# Patient Record
Sex: Female | Born: 1960 | Race: Black or African American | Hispanic: No | Marital: Single | Smoking: Never smoker
Health system: Southern US, Community
[De-identification: ages and names within clinical notes are randomized; demographics above are authoritative.]

## PROBLEM LIST (undated history)

## (undated) HISTORY — PX: TUBAL LIGATION: SHX77

---

## 1998-02-20 ENCOUNTER — Emergency Department (HOSPITAL_COMMUNITY): Admission: EM | Admit: 1998-02-20 | Discharge: 1998-02-20 | Payer: Self-pay | Admitting: Emergency Medicine

## 1998-04-27 ENCOUNTER — Emergency Department (HOSPITAL_COMMUNITY): Admission: EM | Admit: 1998-04-27 | Discharge: 1998-04-27 | Payer: Self-pay | Admitting: Family Medicine

## 1998-10-27 ENCOUNTER — Emergency Department (HOSPITAL_COMMUNITY): Admission: EM | Admit: 1998-10-27 | Discharge: 1998-10-27 | Payer: Self-pay | Admitting: Emergency Medicine

## 2000-02-08 ENCOUNTER — Inpatient Hospital Stay (HOSPITAL_COMMUNITY): Admission: AD | Admit: 2000-02-08 | Discharge: 2000-02-08 | Payer: Self-pay | Admitting: Obstetrics

## 2000-02-09 ENCOUNTER — Encounter: Payer: Self-pay | Admitting: Obstetrics

## 2000-02-09 ENCOUNTER — Ambulatory Visit (HOSPITAL_COMMUNITY): Admission: AD | Admit: 2000-02-09 | Discharge: 2000-02-09 | Payer: Self-pay | Admitting: Obstetrics

## 2000-02-22 ENCOUNTER — Encounter (INDEPENDENT_AMBULATORY_CARE_PROVIDER_SITE_OTHER): Payer: Self-pay | Admitting: Specialist

## 2000-02-22 ENCOUNTER — Encounter: Payer: Self-pay | Admitting: Obstetrics

## 2000-02-22 ENCOUNTER — Ambulatory Visit (HOSPITAL_COMMUNITY): Admission: AD | Admit: 2000-02-22 | Discharge: 2000-02-22 | Payer: Self-pay | Admitting: Obstetrics

## 2001-10-19 ENCOUNTER — Emergency Department (HOSPITAL_COMMUNITY): Admission: EM | Admit: 2001-10-19 | Discharge: 2001-10-19 | Payer: Self-pay

## 2002-09-20 ENCOUNTER — Inpatient Hospital Stay (HOSPITAL_COMMUNITY): Admission: AD | Admit: 2002-09-20 | Discharge: 2002-09-20 | Payer: Self-pay | Admitting: Obstetrics and Gynecology

## 2002-09-23 ENCOUNTER — Other Ambulatory Visit: Admission: RE | Admit: 2002-09-23 | Discharge: 2002-09-23 | Payer: Self-pay | Admitting: Obstetrics and Gynecology

## 2002-11-19 ENCOUNTER — Encounter: Payer: Self-pay | Admitting: Obstetrics and Gynecology

## 2002-11-19 ENCOUNTER — Ambulatory Visit (HOSPITAL_COMMUNITY): Admission: RE | Admit: 2002-11-19 | Discharge: 2002-11-19 | Payer: Self-pay | Admitting: Obstetrics and Gynecology

## 2002-12-17 ENCOUNTER — Encounter: Payer: Self-pay | Admitting: Obstetrics and Gynecology

## 2002-12-17 ENCOUNTER — Ambulatory Visit (HOSPITAL_COMMUNITY): Admission: RE | Admit: 2002-12-17 | Discharge: 2002-12-17 | Payer: Self-pay | Admitting: Obstetrics and Gynecology

## 2003-04-22 ENCOUNTER — Inpatient Hospital Stay (HOSPITAL_COMMUNITY): Admission: AD | Admit: 2003-04-22 | Discharge: 2003-04-22 | Payer: Self-pay | Admitting: Obstetrics and Gynecology

## 2003-04-27 ENCOUNTER — Inpatient Hospital Stay (HOSPITAL_COMMUNITY): Admission: AD | Admit: 2003-04-27 | Discharge: 2003-04-27 | Payer: Self-pay | Admitting: Obstetrics and Gynecology

## 2003-04-28 ENCOUNTER — Inpatient Hospital Stay (HOSPITAL_COMMUNITY): Admission: AD | Admit: 2003-04-28 | Discharge: 2003-05-01 | Payer: Self-pay | Admitting: Obstetrics and Gynecology

## 2003-04-28 ENCOUNTER — Encounter (INDEPENDENT_AMBULATORY_CARE_PROVIDER_SITE_OTHER): Payer: Self-pay | Admitting: *Deleted

## 2004-04-22 ENCOUNTER — Emergency Department (HOSPITAL_COMMUNITY): Admission: EM | Admit: 2004-04-22 | Discharge: 2004-04-22 | Payer: Self-pay | Admitting: Emergency Medicine

## 2005-06-17 ENCOUNTER — Emergency Department (HOSPITAL_COMMUNITY): Admission: EM | Admit: 2005-06-17 | Discharge: 2005-06-17 | Payer: Self-pay | Admitting: Emergency Medicine

## 2008-07-05 ENCOUNTER — Encounter: Admission: RE | Admit: 2008-07-05 | Discharge: 2008-07-05 | Payer: Self-pay | Admitting: Obstetrics and Gynecology

## 2009-08-05 ENCOUNTER — Encounter: Admission: RE | Admit: 2009-08-05 | Discharge: 2009-08-05 | Payer: Self-pay | Admitting: Obstetrics and Gynecology

## 2010-10-27 NOTE — Discharge Summary (Signed)
NAME:  Tammy Villarreal, Tammy Villarreal                         ACCOUNT NO.:  [id number]   MEDICAL RECORD NO.:  [id number]                   PATIENT TYPE:  INP   LOCATION:  9143                                 [facility]   PHYSICIAN:  Osborn Coho, M.D.                DATE OF BIRTH:  [birth year]   DATE OF ADMISSION:  04/28/2003  DATE OF DISCHARGE:  05/01/2003                                 DISCHARGE SUMMARY   ADMISSION DIAGNOSES:  1. Intrauterine pregnancy at 41-3/7 weeks.  2. Favorable cervix for induction of labor.   DISCHARGE DIAGNOSES:  1. Intrauterine pregnancy at 41-3/7 weeks.  2. Favorable cervix for induction of labor.  3. Nonreassuring fetal heart rate tracing.  4. Nuchal cord.  5. Status post low transverse cesarean section.  6. Status post bilateral tubal ligation for sterilization.  7. Premature atrial and junctional beat.  8. Bottle-feeding.   PROCEDURES THIS ADMISSION:  1. A primary low transverse cesarean section for delivery of a viable female     infant who had Apgars of 6 and 8 and weighed 6 pounds 1 ounce with cord     pH of 7.31 on April 28, 2003 attended in delivery by Dr. Normand Sloop and     Mack Guise, C.N.M.  2. Bilateral tubal ligation for sterilization at the same time of cesarean     section.   HOSPITAL COURSE:  [name] is a 50 year old black female gravida 5, para  3-0-1-4, at 41-3/7 weeks who was admitted for induction of labor secondary  to being postdates.  Her admission was initiated by artificial rupture of  the membranes however, no fluid was noted.  Several hours following  artificial rupture of the membranes she was having more uterine contractions  but that fetal heart rate also had significant variables down to the 60s for  approximately 5 minutes.  They were repetitive with the contractions and did  not resolve with amnioinfusion or position changes.  Her cervix at this time  was still 3 cm and it was recommended that she proceed with a  cesarean  section for delivery secondary to persistent variable decelerations.  The  patient already had indicated a desire for bilateral tubal ligation for  sterilization and underwent cesarean section for delivery and bilateral  tubal ligation for sterilization during her cesarean section.  She was  delivered of a viable female infant who had Apgars of 6 and 8 and weighed 6  pounds 1 ounce with a cord pH of 7.31 attended in delivery by Dr. Normand Sloop  and Mack Guise, C.N.M.  Please see operative note for further details.  Postoperatively the patient was noted to have irregular junctional rhythm  and PVCs and was monitored by telemetry for 24 hours following surgery.  She  had a cardiology consult which did diagnose premature atrial and junctional  beat but without symptoms and was therefore, recommended no specific  treatment unless  she became symptomatic.  She has continued to be  asymptomatic with any cardiac issues.  She is ambulating, voiding, and  eating without difficulty.  Her vital signs are stable, she has been  afebrile, and she is deemed ready for discharge today.   DISCHARGE INSTRUCTIONS:  Her discharge instructions are as per the [facility].   DISCHARGE MEDICATIONS:  1. Motrin 600 mg p.o. q.6h. p.r.n. for pain.  2. Tylox one to two p.o. q.4-6h. p.r.n. for pain.  3. Prenatal vitamins daily.   DISCHARGE LABORATORIES:  Her WBC count is 9.8, hemoglobin is 11, and  platelets are 269.   DISCHARGE FOLLOWUP:  Four to six weeks at [facility] or p.r.n.     Concha Pyo. Duplantis, C.N.M.              Osborn Coho, M.D.    SJD/MEDQ  D:  05/01/2003  T:  05/01/2003  Job:  [id number]

## 2010-10-27 NOTE — H&P (Signed)
NAME:  Tammy Villarreal, Tammy Villarreal                         ACCOUNT NO.:  [id number]   MEDICAL RECORD NO.:  [id number]                   PATIENT TYPE:  INP   LOCATION:  9173                                 [facility]   PHYSICIAN:  Naima A. Dillard, M.D.              DATE OF BIRTH:  [birth year]   DATE OF ADMISSION:  04/28/2003  DATE OF DISCHARGE:                                HISTORY & PHYSICAL   ADMITTING DIAGNOSES:  1. Intrauterine pregnancy at 41 and three-sevenths weeks.  2. Favorable cervix.  3. Induction of labor for post dates.  4. Desires sterilization.   Ms. Bouza is a gravida 5 para 3-0-1-4 at 26 and three-sevenths weeks; EDD  April 18, 2003 by 13-week ultrasound confirmed with follow-up ultrasound.  She is admitted for induction of labor secondary to post dates with  favorable cervix.  She does report positive fetal movement and regular  contractions.  No bleeding, no rupture of membranes.  She denies any PIH  symptoms; no headache, visual changes, or epigastric pain.  Her pregnancy  has been followed by the CNM service at [facility] and is remarkable for:  1. Advanced maternal age, amniocentesis declined.  2. Questionable LMP with irregular cycles.  3. Obesity.  4. Group B strep negative.  5. Desires sterilization.   This patient was initially evaluated at the office of [facility] on September 16, 2002  at approximately eight weeks gestation.  EDC determined by pregnancy  ultrasonography at 13 weeks three days and confirmed with follow-up  ultrasound.  Her pregnancy has been remarkable for nausea and vomiting early  in pregnancy with hyperemesis.  This did resolve at approximately 14-16  weeks at which time her nausea decreased and she began eating and gaining  weight.  She has continued to have ptyalism with excessive spitting  throughout her pregnancy.  Otherwise, she has been size equal to dates,  normotensive, with no proteinuria.   PRENATAL LABORATORY DATA:  Prenatal lab work on  September 23, 2002:  Hemoglobin  and hematocrit 12.9 and 39.5; platelets 365,000.  Blood type and Rh O  positive, antibody screen negative.  Sickle cell trait negative.  VDRL  nonreactive.  Rubella immune.  Hepatitis B surface antigen negative.  HIV  nonreactive.  Pap smear within normal limits.  GC and chlamydia negative.  Quad screen declined.  At 28 weeks one-hour glucose challenge was within  normal limits and hemoglobin 11.9.  At 36 weeks culture of the vaginal tract  is negative for group B strep and also negative for GC and chlamydia.   OBSTETRICAL HISTORY:  1. In 1984 the patient had a normal spontaneous vaginal delivery at term of     a 7-pound 7-ounce female infant named [place] with no complications.  2. In 1985 the patient had a normal spontaneous vaginal delivery at term     with the birth of an 8-pound female infant named Tania with  no     complications.  3. In 1987 the patient had a twin gestation and at term she had two vaginal     deliveries.  The first baby weighed 7 pounds, a girl named Sherrelle and     the second girl 7 pounds also, Shantelle.  There were no complications     with that pregnancy or delivery.  4. In 2002 the patient had a first trimester SAB with a D&C and no     complications.  5. This current pregnancy is with a different father of the baby and he is     involved and supportive.   MEDICAL HISTORY:  The patient has no known drug allergies and denies the use  of tobacco, alcohol, or illicit drugs.  Medical history is significant for  the patient had a blood transfusion after delivery of her twins in 65 and  had her wisdom teeth removed in 2002.  Otherwise, her history is  unremarkable.   FAMILY HISTORY:  The patient's father with MI.  Mother - varicose veins.  The patient's father with a history of diabetes.   GENETIC HISTORY:  Significant for the patient with advanced maternal age, no  amniocentesis.  There has been no family history of genetic or  chromosomal  disorders, children that died in infancy, or that were born with birth  defects.   SOCIAL HISTORY:  Ms. Heinke is a 50 year old African-American female.  The  father of the baby, Hatim Nour, is involved and supportive.  They are  [place] in their faith.   REVIEW OF SYSTEMS:  Is as described above.  The patient is typical of one  with a uterine pregnancy at term.  She is contracting irregularly, her baby  is reactive, cervix favorable, and she is admitted for induction of labor.   PHYSICAL EXAMINATION:  VITAL SIGNS:  Stable, afebrile.  HEENT:  Unremarkable.  HEART:  Regular rate and rhythm.  LUNGS:  Clear.  ABDOMEN:  Gravid in it contour.  Uterine fundus is noted to extend 38 cm  above the level of the pubic symphysis.  Leopold's maneuvers finds the  infant to be a longitudinal lie, cephalic presentation, and the estimated  fetal weight is 7.5 pounds.  The baseline of the fetal heart rate is 150s  with positive variability, positive accelerations.  Fetal heart rate is  reactive with no periodic changes.  The patient is contracting irregularly.  PELVIC:  Digital exam of the cervix finds it to be 2-3 cm dilated, 60-70%  effaced.  The cephalic presenting part is at a -2 station.  Artificial  rupture of membranes was attempted.  There are no forewaters and no fluid  return was noted.  EXTREMITIES:  Show no pathologic edema.  DTRs are 1+ with no clonus.   ASSESSMENT:  1. Intrauterine pregnancy at 41 and three-sevenths weeks.  2. Induction of labor for post dates.  3. Desires sterilization.   PLAN:  Will follow expectantly at present.  The patient will be out of bed  and ambulate.  Following that the plan will be to reevaluate her cervix and  contraction pattern and if no progress at that time the plan is to start  Pitocin to augment labor.  The risks and benefits associated with Pitocin including possible fetal distress and increased risk of C-section were  discussed.   The patient is in agreement with above plan.     [name], C.N.M.  Naima A. Normand Sloop, M.D.    SDM/MEDQ  D:  04/28/2003  T:  04/28/2003  Job:  [id number]

## 2010-10-27 NOTE — Op Note (Signed)
[facility]  Patient:    Tammy Villarreal, Tammy Villarreal                        MRN: [id number] Proc. Date: 02/22/00 Attending:  Kathreen Cosier, M.D.                           Operative Report  PREOPERATIVE DIAGNOSIS:       Intrauterine fetal demise between seven and                               11 weeks.  POSTOPERATIVE DIAGNOSIS:      Intrauterine fetal demise between seven and                               11 weeks.  OPERATION:                    D&E.  SURGEON:                      Kathreen Cosier, M.D.  ASSISTANT:  ANESTHESIA:                   MAC anesthesia.  DESCRIPTION OF PROCEDURE:     Using MAC with the patient in the lithotomy position, perineum and vagina were prepped and draped.  Bladder was emptied with straight catheter.  Bimanual examination revealed uterus 10 weeks size, retroverted.  Weighted speculum was placed in the vagina.  Anterior lip of the cervix was grasped with the tenaculum.  Cervix easily dilated to #29 Shawnie Pons. Using a #8 suction, the uterine contents aspirated until the cavity was clean. The patient tolerated the procedure well and was taken to the recovery room in good condition. DD:  03/14/00 TD:  03/14/00 Job: [id number]

## 2010-10-27 NOTE — Op Note (Signed)
NAME:  Tammy Villarreal, Tammy Villarreal                         ACCOUNT NO.:  [id number]   MEDICAL RECORD NO.:  [id number]                   PATIENT TYPE:  INP   LOCATION:  9371                                 [facility]   PHYSICIAN:  Tammy Villarreal, M.D.              DATE OF BIRTH:  [birth year]   DATE OF PROCEDURE:  04/28/2003  DATE OF DISCHARGE:                                 OPERATIVE REPORT   PREOPERATIVE DIAGNOSES:  1. Pregnancy at 41 weeks.  2. Nonreassuring fetal heart tones.  3. Failed induction.   POSTOPERATIVE DIAGNOSES:  1. Pregnancy at 41 weeks.  2. Nonreassuring fetal heart tones.  3. Failed induction.   PROCEDURES:  1. Primary low transverse cesarean section.  2. Bilateral tubal ligation.   ANESTHESIA:  Spinal.   SURGEON:  Tammy Villarreal, M.D.   ASSESSMENT:  Tammy Villarreal, C.N.M.   ESTIMATED BLOOD LOSS:  500 mL.   URINE OUTPUT:  500 mL.   FLUIDS REPLACED:  2800 mL crystalloid.   FINDINGS:  A female infant in vertex presentation.  Terminal meconium noted.  There was a nuchal cord x1.  There was no fluid seen.  Weight was 6 pounds 1  ounce.  Cord pH was 7.31.  The patient had normal-appearing uterus, tubes,  and ovaries bilaterally.  There were no complications for the procedure.  The patient was consented for cesarean section.  She was told the risks are  but not limited to bleeding, infection, damage to internal organs such as  bowel or bladder or major blood vessels, and complications from the  anesthesia.  She also understood the tubal failure rate to be about one in  200 to one in 300.  Half of pregnancies that do occur are ectopic  pregnancies, and all birth control was reviewed.  The patient still wanted  to proceed with C-section and tubal ligation.   She was taken to the operating room and given spinal anesthesia, placed in  the dorsal supine position with a left lateral tilt.  A Pfannenstiel skin  incision was then made with the knife and carried  down with the Bovie.  The  fascia was incised in the midline, extended bilaterally using pickups with  teeth and Mayos.  Kochers x2 were placed on the superior aspect of the  fascia, which was dissected off the rectus muscle both superiorly and  inferiorly with blunt and sharp dissection.  The rectus muscle was separated  in the midline.  The peritoneum was identified, tented up and entered  sharply, and extended superiorly and inferiorly with good visualization of  bowel and bladder.  The vesicouterine peritoneum was identified, tented up,  and entered sharply with Metzenbaum scissors and extended bilaterally. A  bladder blade was inserted.  A lower transverse uterine incision was made  with a scalpel and extended bilaterally using bandage scissors.  The infant  was delivered.  It was delivered  without difficulty.  Mouth and nares were  bulb-suctioned.  A nuchal cord x1 was reduced.  The body was delivered,  handed to pediatric in attendance.  The cord was clamped and cut before  handing over to pediatricians.  Cord gases and cord blood were obtained.  The placenta was manually removed.  The uterus was cleared of all clot and  debris.  The uterine incision was repaired with 0 Vicryl in a running locked  fashion.  A second layer of imbrication with 0 Vicryl was used and  hemostasis was assured.  The patient's left fallopian tube was identified,  followed out to the fimbriated end, and grasped with Babcock clamps and  about a centimeter of the isthmic portion of the tube was ligated with 2-0  plain and excised.  Hemostasis was assured.  The patient's right fallopian  tube was manipulated in a similar fashion, excised in a similar fashion.  About a 1 cm mid-isthmic portion of the tube was ligated with 2-0 plain.  Hemostasis was assured.  Irrigation was then done in the abdomen.  There was  noted to be another area of bleeding, which was on the uterus, which was  made hemostatic with a  figure-of-eight 0 Vicryl stitch.  Once hemostasis was  obtained, the peritoneum was closed with 0 chromic.  The fascia was closed  with 0 Vicryl in a running fashion.  The subcutaneous tissue was made  hemostatic with Bovie cautery.  A Jackson-Pratt drain was placed.  The skin  was closed with staples.  Sponge, lap, and needle counts were correct x2.  The patient went to the recovery room in stable condition.                                               Tammy Villarreal, M.D.    NAD/MEDQ  D:  04/28/2003  T:  04/29/2003  Job:  [id number]

## 2010-10-27 NOTE — Consult Note (Signed)
NAMELOUCILE, POSNER NO.:  [id number]   MEDICAL RECORD NO.:  [id number]                   PATIENT TYPE:   LOCATION:                                       FACILITY:   PHYSICIAN:  Lesleigh Noe, M.D.            DATE OF BIRTH:   DATE OF CONSULTATION:  04/28/2003  DATE OF DISCHARGE:                                   CONSULTATION   CONSULTING PHYSICIAN:  Lyn Records, M.D.   INDICATION:  Cardiac arrhythmia following C-section.   CONCLUSIONS:  1. Variable heart rate due to frequent PACs and premature junctional     contractions, asymptomatic.'  2. Cesarean section with delivery of healthy baby.  3. No prior history of heart disease, arrhythmia.  Does have a family     history of coronary artery disease, her father who is diabetic and obese.   RECOMMENDATIONS:  1. Observation.  There is no clinical indication for further cardiac     evaluation unless sustained tachy or BRADY arrhythmia or other     cardiopulmonary complaint.  2. EKG, to establish baseline.  3. May transfer to non-telemetry floor in a.m. if the patient remains     asymptomatic and has no sustained arrhythmia.   COMMENT:  The patient is 42-years-of-age, has a family history of coronary  artery disease (father who is diabetic).  She underwent a cesarean section  today, delivering a normal baby.  There was no mention of complications  during the procedure.  There were no prenatal obstetrical complications.  The patient is asymptomatic despite the presence of frequent PACs, a  variable heart rate, and a reported heart rate as low as 29, although I can  not find this documented any place in the chart.   SIGNIFICANT PAST MEDICAL HISTORY:  None.  Specifically denies diabetes,  cigarette smoking, hypertension, hypercholesterolemia.   PREVIOUS OBSTETRICAL HISTORY:  Has four children, all in good health.   ALLERGIES:  None known.   HABITS:  Does not smoke or drink.   PHYSICAL  EXAMINATION:  GENERAL:  The patient is in no acute distress.  VITAL SIGNS:  Heart rate is between 60-80, sinus rhythm with frequent PACs.  No sustained tachy arrhythmias are noted.  Blood pressure 118/70.  NECK:  Neck veins are not distended.  LUNGS:  Clear.  CARDIAC:  With the exception of irregular rhythm, no abnormalities noted.  No murmur.  No click.  No rub.  EXTREMITIES:  No edema.   Resting 12-lead EKG is normal, other than sinus tachycardia at the time  performed at 3:51 p.m.   LABORATORY DATA:  Laboratory data including potassium and magnesium is  pending.  Lesleigh Noe, M.D.    HWS/MEDQ  D:  04/28/2003  T:  04/29/2003  Job:  [id number]   cc:   Naima A. Normand Sloop, M.D.  [address]  [place]  [place] [address]  Fax: [phone]

## 2012-10-07 ENCOUNTER — Other Ambulatory Visit (HOSPITAL_COMMUNITY)
Admission: RE | Admit: 2012-10-07 | Discharge: 2012-10-07 | Disposition: A | Discharge status: Home / Self Care | Payer: Managed Care, Other (non HMO) | Source: Ambulatory Visit | Attending: Family Medicine | Admitting: Family Medicine

## 2012-10-07 ENCOUNTER — Other Ambulatory Visit: Payer: Self-pay | Admitting: Family Medicine

## 2012-10-07 DIAGNOSIS — Z01419 Encounter for gynecological examination (general) (routine) without abnormal findings: Secondary | ICD-10-CM | POA: Insufficient documentation

## 2012-10-07 DIAGNOSIS — Z1151 Encounter for screening for human papillomavirus (HPV): Secondary | ICD-10-CM | POA: Insufficient documentation

## 2013-08-22 ENCOUNTER — Ambulatory Visit (INDEPENDENT_AMBULATORY_CARE_PROVIDER_SITE_OTHER): Payer: Managed Care, Other (non HMO) | Admitting: Emergency Medicine

## 2013-08-22 VITALS — BP 112/76 | HR 56 | Temp 98.6°F | Resp 16 | Ht 67.0 in | Wt 201.0 lb

## 2013-08-22 DIAGNOSIS — J209 Acute bronchitis, unspecified: Secondary | ICD-10-CM

## 2013-08-22 MED ORDER — AZITHROMYCIN 250 MG PO TABS: ORAL_TABLET | ORAL | Status: DC

## 2013-08-22 MED ORDER — PROMETHAZINE-CODEINE 6.25-10 MG/5ML PO SYRP: 5.0000 mL | ORAL_SOLUTION | Freq: Four times a day (QID) | ORAL | Status: DC | PRN

## 2013-08-22 NOTE — Progress Notes (Signed)
[facility] [address], [place] [id number] [phone]  Date:  08/22/2013   Name:  Tammy Amelona T Herrin   DOB:  1960/12/07   MRN:  [id number]  PCP:  No primary provider on file.    Chief Complaint: Nasal Congestion, Cough and Sore Throat   History of Present Illness:  Tammy Villarreal is a 53 y.o. very pleasant female patient who presents with the following:  Ill for two weeks with nasal congestion and drainage.  Has post nasal drip and sore throat.  No fever but chills.  No shortness of breath but wheezes at times.  Has cough productive of green sputum.  No nausea or vomiting.  No stool changes or rash.  No systemic issues.  No improvement with over the counter medications or other home remedies. Denies other complaint or health concern today.   There are no active problems to display for this patient.   History reviewed. No pertinent past medical history.  Past Surgical History  Procedure Laterality Date  . Cesarean section      History  Substance Use Topics  . Smoking status: Never Smoker   . Smokeless tobacco: Not on file  . Alcohol Use: Not on file    No family history on file.  No Known Allergies  Medication list has been reviewed and updated.  No current outpatient prescriptions on file prior to visit.   No current facility-administered medications on file prior to visit.    Review of Systems:  As per HPI, otherwise negative.    Physical Examination: Filed Vitals:   08/22/13 1116  BP: 112/76  Pulse: 56  Temp: 98.6 F (37 C)  Resp: 16   Filed Vitals:   08/22/13 1116  Height: 5\' 7"  (1.702 m)  Weight: 201 lb (91.173 kg)   Body mass index is 31.47 kg/(m^2). Ideal Body Weight: Weight in (lb) to have BMI = 25: 159.3  GEN: WDWN, NAD, Non-toxic, A & O x 3 HEENT: Atraumatic, Normocephalic. Neck supple. No masses, No LAD. Ears and Nose: No external deformity. CV: RRR, No M/G/R. No JVD. No thrill. No extra heart sounds. PULM:  CTA B, no wheezes, crackles, rhonchi. No retractions. No resp. distress. No accessory muscle use. ABD: S, NT, ND, +BS. No rebound. No HSM. EXTR: No c/c/e NEURO Normal gait.  PSYCH: Normally interactive. Conversant. Not depressed or anxious appearing.  Calm demeanor.    Assessment and Plan: Bronchitis zpak Phen c cod  Signed,  Phillips OdorJeffery Zyanya Glaza [place], MD

## 2013-08-22 NOTE — Patient Instructions (Signed)

## 2014-02-01 ENCOUNTER — Other Ambulatory Visit: Payer: Self-pay

## 2014-02-01 DIAGNOSIS — Z1231 Encounter for screening mammogram for malignant neoplasm of breast: Secondary | ICD-10-CM

## 2014-02-12 ENCOUNTER — Ambulatory Visit
Admission: RE | Admit: 2014-02-12 | Discharge: 2014-02-12 | Disposition: A | Discharge status: Home / Self Care | Payer: Managed Care, Other (non HMO) | Source: Ambulatory Visit

## 2014-02-12 DIAGNOSIS — Z1231 Encounter for screening mammogram for malignant neoplasm of breast: Secondary | ICD-10-CM

## 2014-02-17 ENCOUNTER — Other Ambulatory Visit: Payer: Self-pay | Admitting: Family Medicine

## 2014-02-17 DIAGNOSIS — R928 Other abnormal and inconclusive findings on diagnostic imaging of breast: Secondary | ICD-10-CM

## 2014-02-25 ENCOUNTER — Ambulatory Visit
Admission: RE | Admit: 2014-02-25 | Discharge: 2014-02-25 | Disposition: A | Discharge status: Home / Self Care | Payer: Managed Care, Other (non HMO) | Source: Ambulatory Visit | Attending: Family Medicine | Admitting: Family Medicine

## 2014-02-25 DIAGNOSIS — R928 Other abnormal and inconclusive findings on diagnostic imaging of breast: Secondary | ICD-10-CM

## 2015-02-14 ENCOUNTER — Emergency Department (HOSPITAL_BASED_OUTPATIENT_CLINIC_OR_DEPARTMENT_OTHER): Payer: Managed Care, Other (non HMO)

## 2015-02-14 ENCOUNTER — Emergency Department (HOSPITAL_BASED_OUTPATIENT_CLINIC_OR_DEPARTMENT_OTHER)
Admission: EM | Admit: 2015-02-14 | Discharge: 2015-02-14 | Disposition: A | Discharge status: Home / Self Care | Payer: Managed Care, Other (non HMO) | Attending: Emergency Medicine | Admitting: Emergency Medicine

## 2015-02-14 ENCOUNTER — Encounter (HOSPITAL_BASED_OUTPATIENT_CLINIC_OR_DEPARTMENT_OTHER): Payer: Self-pay

## 2015-02-14 DIAGNOSIS — R1031 Right lower quadrant pain: Secondary | ICD-10-CM | POA: Insufficient documentation

## 2015-02-14 DIAGNOSIS — Z3202 Encounter for pregnancy test, result negative: Secondary | ICD-10-CM | POA: Diagnosis not present

## 2015-02-14 DIAGNOSIS — R109 Unspecified abdominal pain: Secondary | ICD-10-CM

## 2015-02-14 LAB — CBC WITH DIFFERENTIAL/PLATELET
BASOS PCT: 0 % (ref 0–1)
Basophils Absolute: 0 10*3/uL (ref 0.0–0.1)
EOS ABS: 0.2 10*3/uL (ref 0.0–0.7)
Eosinophils Relative: 3 % (ref 0–5)
HEMATOCRIT: 36 % (ref 36.0–46.0)
HEMOGLOBIN: 11.7 g/dL — AB (ref 12.0–15.0)
LYMPHS ABS: 2.2 10*3/uL (ref 0.7–4.0)
Lymphocytes Relative: 32 % (ref 12–46)
MCH: 27.3 pg (ref 26.0–34.0)
MCHC: 32.5 g/dL (ref 30.0–36.0)
MCV: 84.1 fL (ref 78.0–100.0)
Monocytes Absolute: 0.6 10*3/uL (ref 0.1–1.0)
Monocytes Relative: 9 % (ref 3–12)
NEUTROS ABS: 3.8 10*3/uL (ref 1.7–7.7)
NEUTROS PCT: 56 % (ref 43–77)
Platelets: 293 10*3/uL (ref 150–400)
RBC: 4.28 MIL/uL (ref 3.87–5.11)
RDW: 14.2 % (ref 11.5–15.5)
WBC: 6.8 10*3/uL (ref 4.0–10.5)

## 2015-02-14 LAB — URINALYSIS, ROUTINE W REFLEX MICROSCOPIC
Bilirubin Urine: NEGATIVE
GLUCOSE, UA: NEGATIVE mg/dL
Hgb urine dipstick: NEGATIVE
KETONES UR: NEGATIVE mg/dL
LEUKOCYTES UA: NEGATIVE
NITRITE: NEGATIVE
PH: 7.5 (ref 5.0–8.0)
Protein, ur: NEGATIVE mg/dL
SPECIFIC GRAVITY, URINE: 1.024 (ref 1.005–1.030)
Urobilinogen, UA: 1 mg/dL (ref 0.0–1.0)

## 2015-02-14 LAB — BASIC METABOLIC PANEL
ANION GAP: 5 (ref 5–15)
BUN: 10 mg/dL (ref 6–20)
CHLORIDE: 108 mmol/L (ref 101–111)
CO2: 24 mmol/L (ref 22–32)
CREATININE: 0.85 mg/dL (ref 0.44–1.00)
Calcium: 8.8 mg/dL — ABNORMAL LOW (ref 8.9–10.3)
GFR calc non Af Amer: >60 mL/min (ref >60)
Glucose, Bld: 88 mg/dL (ref 65–99)
POTASSIUM: 4 mmol/L (ref 3.5–5.1)
SODIUM: 137 mmol/L (ref 135–145)

## 2015-02-14 LAB — PREGNANCY, URINE: Preg Test, Ur: NEGATIVE

## 2015-02-14 MED ORDER — KETOROLAC TROMETHAMINE 30 MG/ML IJ SOLN
30.0000 mg | Freq: Once | INTRAMUSCULAR | Status: AC
  Administered 2015-02-14: 30 mg via INTRAVENOUS
  Filled 2015-02-14: qty 1

## 2015-02-14 MED ORDER — HYDROCODONE-ACETAMINOPHEN 5-325 MG PO TABS: 1.0000 | ORAL_TABLET | Freq: Four times a day (QID) | ORAL | Status: AC | PRN

## 2015-02-14 NOTE — ED Notes (Signed)
RLQ/groin pain x 3 days

## 2015-02-14 NOTE — ED Notes (Signed)
MD at bedside. 

## 2015-02-14 NOTE — Discharge Instructions (Signed)
Ibuprofen 600 mg 3 times daily for the next several days.  Hydrocodone as prescribed as needed for pain not relieved with ibuprofen.  Return to the emergency department if symptoms significantly worsen or change.   Flank Pain Flank pain refers to pain that is located on the side of the body between the upper abdomen and the back. The pain may occur over a short period of time (acute) or may be long-term or reoccurring (chronic). It may be mild or severe. Flank pain can be caused by many things. CAUSES  Some of the more common causes of flank pain include:  Muscle strains.   Muscle spasms.   A disease of your spine (vertebral disk disease).   A lung infection (pneumonia).   Fluid around your lungs (pulmonary edema).   A kidney infection.   Kidney stones.   A very painful skin rash caused by the chickenpox virus (shingles).   Gallbladder disease.  HOME CARE INSTRUCTIONS  Home care will depend on the cause of your pain. In general,  Rest as directed by your caregiver.  Drink enough fluids to keep your urine clear or pale yellow.  Only take over-the-counter or prescription medicines as directed by your caregiver. Some medicines may help relieve the pain.  Tell your caregiver about any changes in your pain.  Follow up with your caregiver as directed. SEEK IMMEDIATE MEDICAL CARE IF:   Your pain is not controlled with medicine.   You have new or worsening symptoms.  Your pain increases.   You have abdominal pain.   You have shortness of breath.   You have persistent nausea or vomiting.   You have swelling in your abdomen.   You feel faint or pass out.   You have blood in your urine.  You have a fever or persistent symptoms for more than 2-3 days.  You have a fever and your symptoms suddenly get worse. MAKE SURE YOU:   Understand these instructions.  Will watch your condition.  Will get help right away if you are not doing well or get  worse. Document Released: 07/19/2005 Document Revised: 02/20/2012 Document Reviewed: 01/10/2012 [facility] Patient Information 2015 [place], [place]. This information is not intended to replace advice given to you by your health care provider. Make sure you discuss any questions you have with your health care provider.

## 2015-02-14 NOTE — ED Provider Notes (Signed)
CSN: [id number]     Arrival date & time 02/14/15  1122 History   First MD Initiated Contact with Patient 02/14/15 1158     Chief Complaint  Patient presents with  . Abdominal Pain     (Consider location/radiation/quality/duration/timing/severity/associated sxs/prior Treatment) HPI Comments: Patient is a 54 year old female with no significant past medical history. She presents for evaluation of right lower quadrant pain that started 1 week ago. Her pain is constant in nature and radiates to her right flank. She denies any blood in her urine. She denies any dysuria. She denies any vomiting, diarrhea, or constipation.  Patient is a 54 y.o. female presenting with abdominal pain. The history is provided by the patient.  Abdominal Pain Pain location:  RLQ Pain quality: stabbing   Pain radiates to:  R flank Pain severity:  Moderate Onset quality:  Sudden Duration:  1 week Timing:  Constant Progression:  Worsening Chronicity:  New Relieved by:  Nothing Worsened by:  Nothing tried Ineffective treatments:  None tried   History reviewed. No pertinent past medical history. Past Surgical History  Procedure Laterality Date  . Cesarean section    . Tubal ligation     No family history on file. Social History  Substance Use Topics  . Smoking status: Never Smoker   . Smokeless tobacco: None  . Alcohol Use: No   OB History    No data available     Review of Systems  Gastrointestinal: Positive for abdominal pain.  All other systems reviewed and are negative.     Allergies  Review of patient's allergies indicates no known allergies.  Home Medications   Prior to Admission medications   Not on File   BP 119/78 mmHg  Pulse 67  Temp(Src) 98.2 F (36.8 C) (Oral)  Resp 18  Ht  (1.676 m)  Wt 187 lb (84.823 kg)  BMI 30.20 kg/m2  SpO2 100%  LMP 01/31/2015 Physical Exam  Constitutional: She is oriented to person, place, and time. She appears well-developed and  well-nourished. No distress.  HENT:  Head: Normocephalic and atraumatic.  Neck: Normal range of motion. Neck supple.  Cardiovascular: Normal rate and regular rhythm.  Exam reveals no gallop and no friction rub.   No murmur heard. Pulmonary/Chest: Effort normal and breath sounds normal. No respiratory distress. She has no wheezes.  Abdominal: Soft. Bowel sounds are normal. She exhibits no distension and no mass. There is tenderness. There is no rebound and no guarding.  There is mild tenderness to palpation in the right lower quadrant.  Musculoskeletal: Normal range of motion.  Neurological: She is alert and oriented to person, place, and time.  Skin: Skin is warm and dry. She is not diaphoretic.  Nursing note and vitals reviewed.   ED Course  Procedures (including critical care time) Labs Review Labs Reviewed  URINALYSIS, ROUTINE W REFLEX MICROSCOPIC (NOT AT [facility])  PREGNANCY, URINE    Imaging Review No results found. I have personally reviewed and evaluated these images and lab results as part of my medical decision-making.   EKG Interpretation None      MDM   Final diagnoses:  None    Patient presents with complaints of right flank and right lower quadrant pain. This has been going on for the past week and worsening. Her exam, workup do not suggest an emergent cause. There is no evidence for renal calculus, appendicitis, or ovarian cyst. She is now feeling better with medications and I believe is appropriate for discharge.  I will recommend anti-inflammatory's, prescribed pain medication, and have her return as needed if symptoms worsen or change.    Geoffery Lyons, MD 02/14/15 1352

## 2015-09-08 IMAGING — CT CT RENAL STONE PROTOCOL
2 of 4 series · 16 of 46 positions shown, 18 images · non-contrast
Comparison: None.

CLINICAL DATA: 53-year-old female with right lower quadrant and
groin pain for the past 3 days

EXAM:
CT ABDOMEN AND PELVIS WITHOUT CONTRAST
TECHNIQUE: Multidetector CT imaging of the abdomen and pelvis was performed
following the standard protocol without IV contrast.

[Series 2: renal stone < 200 lbs 5.0 b31f · axial · 0.71mm/px · z∈[+728,+1133]mm · 13 of 89 slices shown, 15 images]
[im 4/89  soft-tissue]
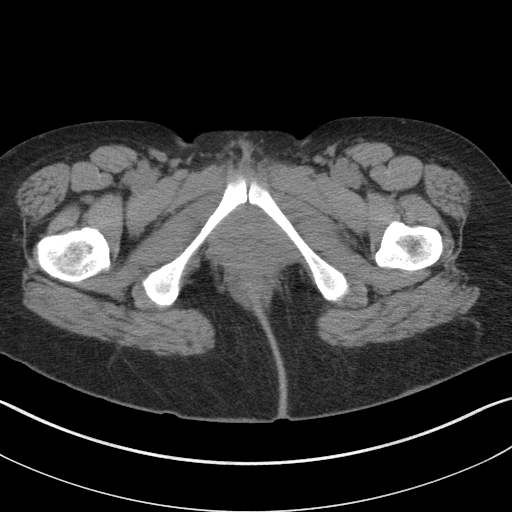
[im 4/89  bone]
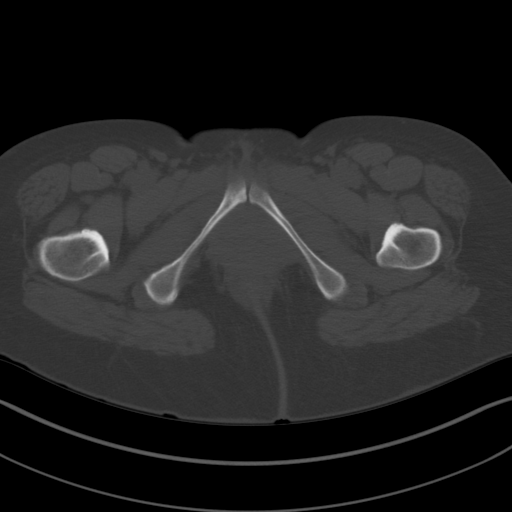
[im 12/89  soft-tissue]
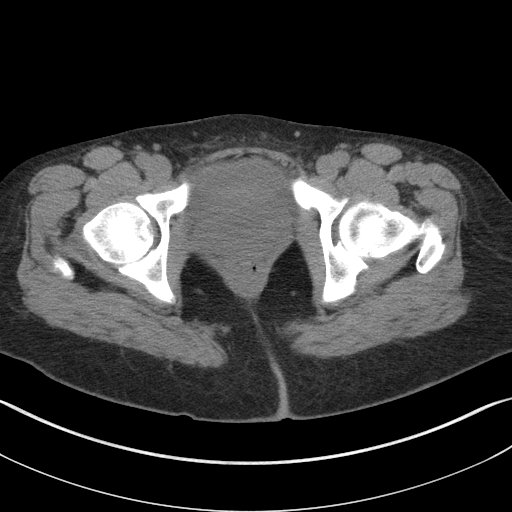
[im 19/89  soft-tissue]
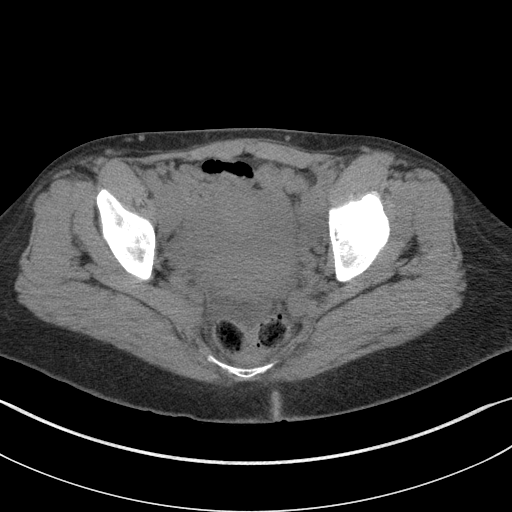
[im 26/89  soft-tissue]
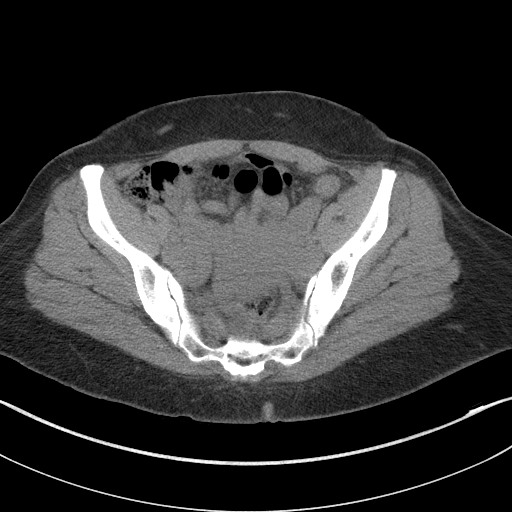
[im 30/89  soft-tissue]
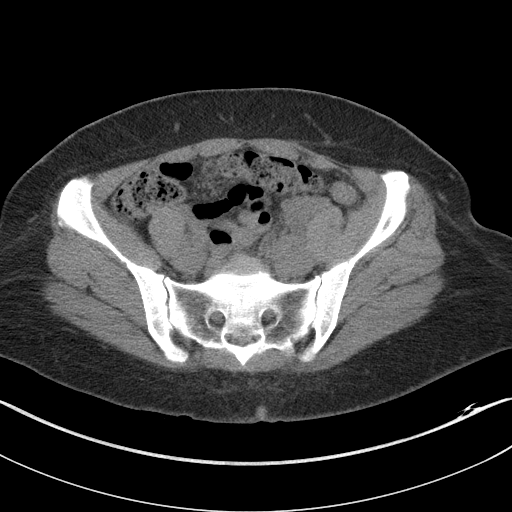
[im 37/89  soft-tissue]
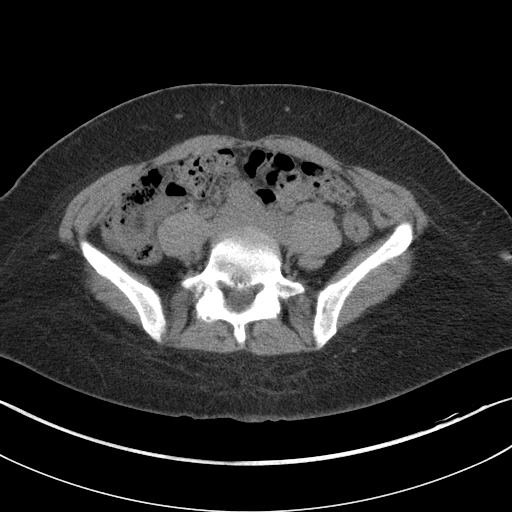
[im 45/89  soft-tissue]
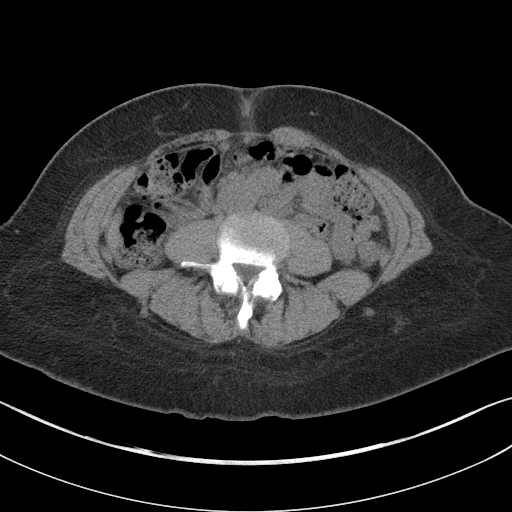
[im 52/89  soft-tissue]
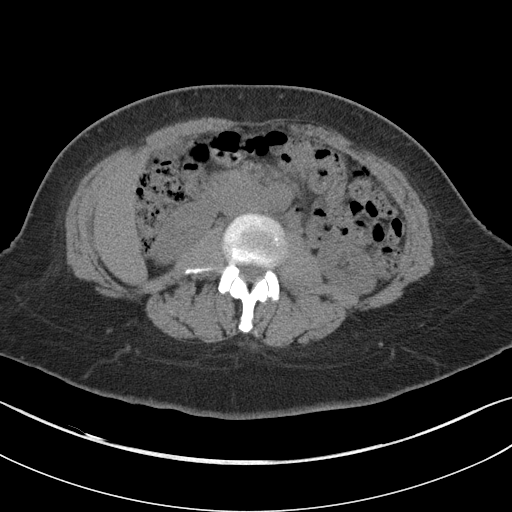
[im 59/89  soft-tissue]
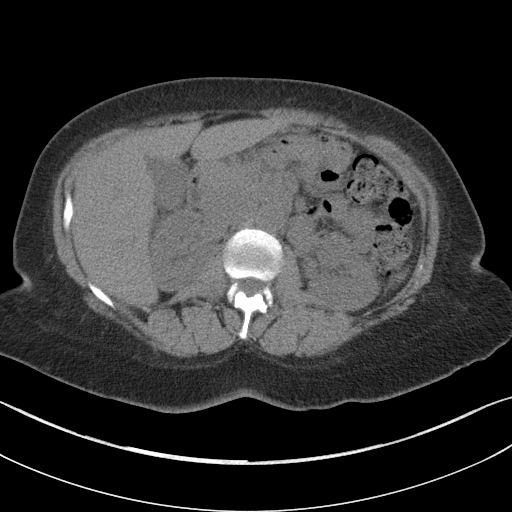
[im 59/89  bone]
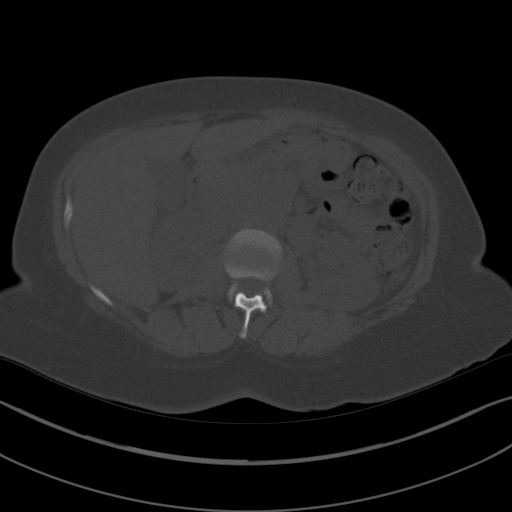
[im 63/89  soft-tissue]
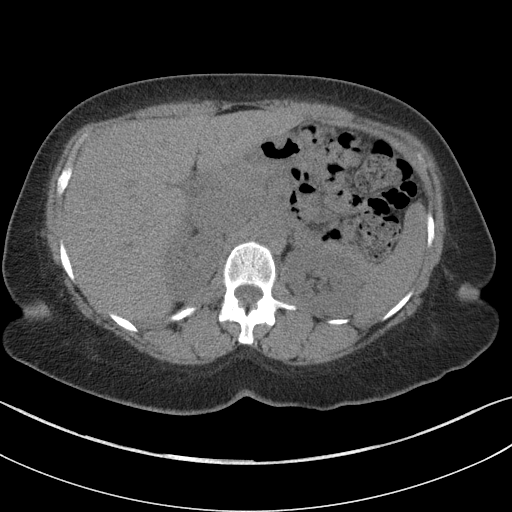
[im 70/89  soft-tissue]
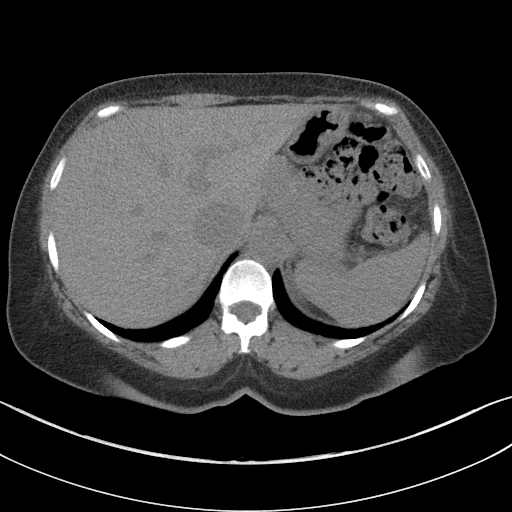
[im 78/89  soft-tissue]
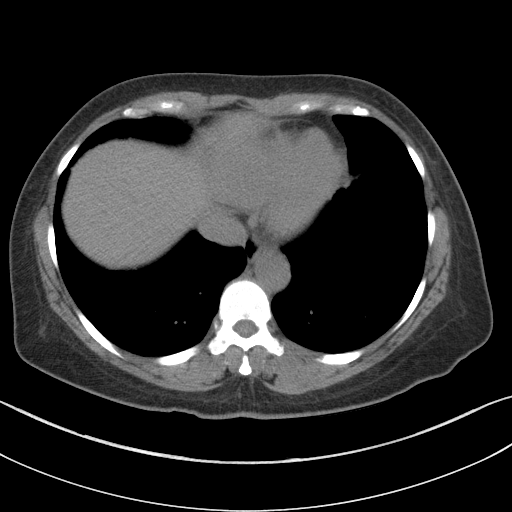
[im 85/89  soft-tissue]
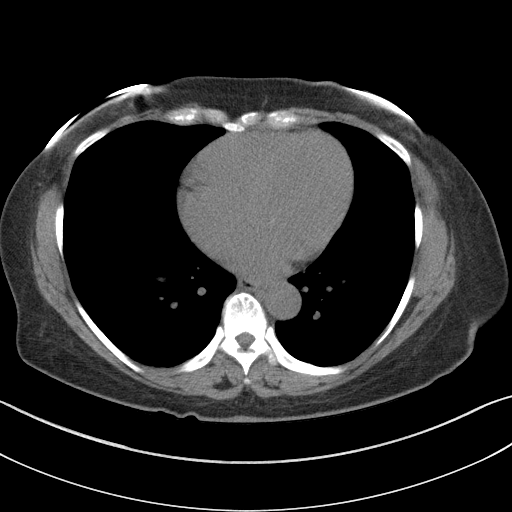

[Series 5: renal stone 3.0 coronal · coronal · 0.77mm/px · 3 of 83 slices shown]
[im 28/83  soft-tissue]
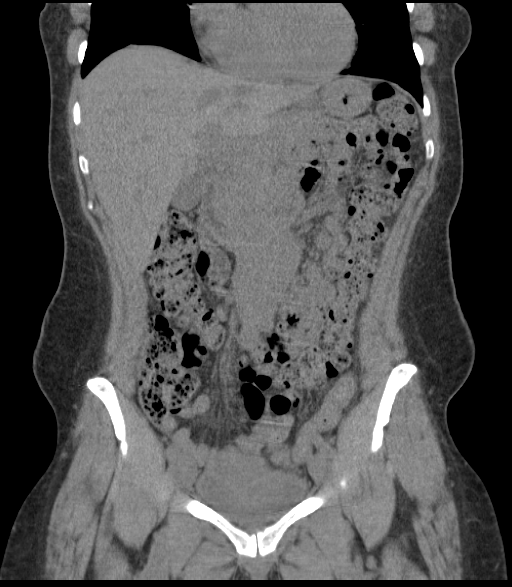
[im 37/83  soft-tissue]
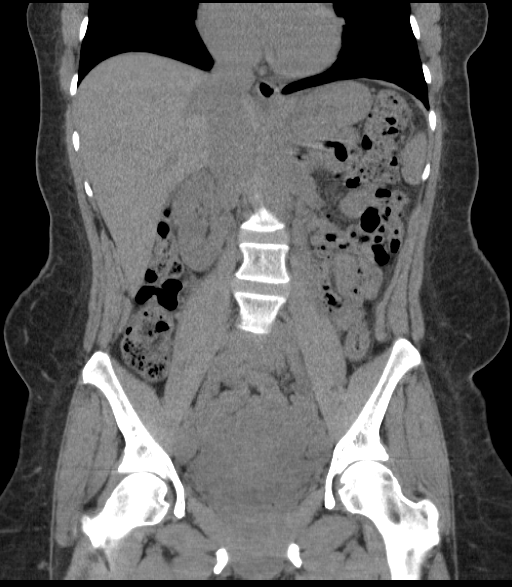
[im 46/83  soft-tissue]
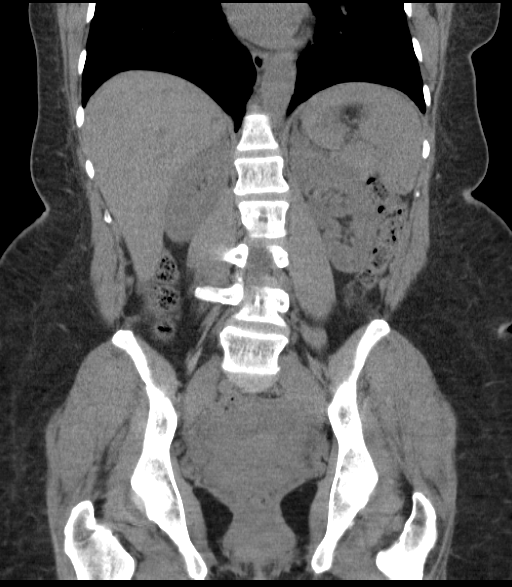

[16 of 46 positions shown; findings below may reference images not displayed]

FINDINGS: Lower Chest: The lung bases are clear. Visualized cardiac structures
are within normal limits for size. No pericardial effusion.
Unremarkable visualized distal thoracic esophagus.

Abdomen: Unenhanced CT was performed per clinician order. Lack of IV
contrast limits sensitivity and specificity, especially for
evaluation of abdominal/pelvic solid viscera. Within these
limitations, unremarkable CT appearance of the stomach, duodenum,
spleen, adrenal glands and pancreas. Of note, there is a relative
paucity of intra-abdominal fat which somewhat limits evaluation of
the adrenal glands and pancreas.

Normal hepatic contour and morphology. Gallbladder is unremarkable.
No intra or extrahepatic biliary ductal dilatation. Unremarkable
appearance of the bilateral kidneys. No focal solid lesion,
hydronephrosis or nephrolithiasis.

No evidence of obstruction or focal bowel wall thickening. Normal
appendix in the right lower quadrant. The terminal ileum is
unremarkable. No free fluid or suspicious adenopathy.

Pelvis: Somewhat globular appearance of the uterus. This may be
secondary to multiparity or underlying uterine fibroids. Evaluation
is limited in the absence of IV contrast. The bladder is
decompressed. No free fluid or adenopathy.

Bones/Soft Tissues: No acute fracture or aggressive appearing lytic
or blastic osseous lesion.

Vascular: Prominence of the bilateral common iliac veins and
inferior vena cava.
IMPRESSION: 1. No definite abnormality in the abdomen or pelvis to explain the
patient's clinical symptoms.
2. Nonspecific prominence of the bilateral common iliac veins and
inferior vena cava. Differential considerations include over
hydration, and nonspecific increased central venous pressure.
3. Globular uterus. This may be secondary to multiparity or
underlying uterine fibroids.

## 2015-12-21 ENCOUNTER — Other Ambulatory Visit: Payer: Self-pay | Admitting: Obstetrics & Gynecology

## 2015-12-21 DIAGNOSIS — Z1231 Encounter for screening mammogram for malignant neoplasm of breast: Secondary | ICD-10-CM

## 2016-01-06 ENCOUNTER — Other Ambulatory Visit: Payer: Self-pay | Admitting: Obstetrics & Gynecology

## 2016-01-06 ENCOUNTER — Other Ambulatory Visit (HOSPITAL_COMMUNITY)
Admission: RE | Admit: 2016-01-06 | Discharge: 2016-01-06 | Disposition: A | Discharge status: Home / Self Care | Payer: Managed Care, Other (non HMO) | Source: Ambulatory Visit | Attending: Obstetrics & Gynecology | Admitting: Obstetrics & Gynecology

## 2016-01-06 DIAGNOSIS — Z01419 Encounter for gynecological examination (general) (routine) without abnormal findings: Secondary | ICD-10-CM | POA: Diagnosis present

## 2016-01-06 DIAGNOSIS — Z1151 Encounter for screening for human papillomavirus (HPV): Secondary | ICD-10-CM | POA: Diagnosis not present

## 2016-01-12 LAB — CYTOLOGY - PAP

## 2016-01-19 ENCOUNTER — Ambulatory Visit
Admission: RE | Admit: 2016-01-19 | Discharge: 2016-01-19 | Disposition: A | Discharge status: Home / Self Care | Payer: Managed Care, Other (non HMO) | Source: Ambulatory Visit | Attending: Obstetrics & Gynecology | Admitting: Obstetrics & Gynecology

## 2016-01-19 DIAGNOSIS — Z1231 Encounter for screening mammogram for malignant neoplasm of breast: Secondary | ICD-10-CM

## 2017-03-14 ENCOUNTER — Other Ambulatory Visit: Payer: Self-pay | Admitting: Obstetrics & Gynecology

## 2017-03-14 DIAGNOSIS — Z1231 Encounter for screening mammogram for malignant neoplasm of breast: Secondary | ICD-10-CM

## 2017-04-01 ENCOUNTER — Ambulatory Visit
Admission: RE | Admit: 2017-04-01 | Discharge: 2017-04-01 | Disposition: A | Discharge status: Home / Self Care | Payer: Managed Care, Other (non HMO) | Source: Ambulatory Visit | Attending: Obstetrics & Gynecology | Admitting: Obstetrics & Gynecology

## 2017-04-01 DIAGNOSIS — Z1231 Encounter for screening mammogram for malignant neoplasm of breast: Secondary | ICD-10-CM

## 2017-07-17 NOTE — Progress Notes (Signed)
Chief Complaint  Patient presents with  . New Patient (Initial Visit)    establish care. Concerns about pain she was having in her head, no pain now but has questions, wants referral to allergist and is wanting form completed for job.  Pt had flu approx. 3 weeks ago, no flu vaccine this season and has had a tetanus in past 10 years.    HPI   Pt is here for physical exam She has a biometrics form for her insurance   Patient is recovering from flu like symptoms  Patient's last menstrual period was 06/06/2017. Contraception: none Gyne: [id number]  PAP SMEAR: 2 months MAMMOGRAM: Up to date, 2 months ago Colonoscopy: up to date  Exercise: 4-5 times  X week  For 90 minutes   History reviewed. No pertinent past medical history.  Current Outpatient Medications  Medication Sig Dispense Refill  . diphenhydrAMINE (BENADRYL) 25 MG tablet Take 25 mg by mouth every 6 (six) hours as needed for allergies.    Marland Kitchen ibuprofen (ADVIL,MOTRIN) 200 MG tablet Take 200 mg by mouth every 6 (six) hours as needed.    . Multiple Vitamin (MULTIVITAMIN) tablet Take 1 tablet by mouth daily.    Marland Kitchen HYDROcodone-acetaminophen (NORCO) 5-325 MG per tablet Take 1-2 tablets by mouth every 6 (six) hours as needed. (Patient not taking: Reported on 07/18/2017) 12 tablet 0   No current facility-administered medications for this visit.     Allergies: Not on File  Past Surgical History:  Procedure Laterality Date  . CESAREAN SECTION    . TUBAL LIGATION      Social History   Socioeconomic History  . Marital status: Single    Spouse name: None  . Number of children: None  . Years of education: None  . Highest education level: None  Social Needs  . Financial resource strain: None  . Food insecurity - worry: Never true  . Food insecurity - inability: Never true  . Transportation needs - medical: No  . Transportation needs - non-medical: No  Occupational History  . None  Tobacco Use  . Smoking status: Never  Smoker  . Smokeless tobacco: Never Used  Substance and Sexual Activity  . Alcohol use: No  . Drug use: No  . Sexual activity: Yes    Birth control/protection: Surgical  Other Topics Concern  . None  Social History Narrative  . None    Family History  Problem Relation Age of Onset  . Hypertension Mother   . Heart disease Father   . Diabetes Father   . Cancer Maternal Aunt 70     Review of Systems  Constitutional: Negative for chills and fever.  Eyes: Negative for blurred vision and double vision.  Respiratory: Negative for cough, shortness of breath and wheezing.   Cardiovascular: Negative for chest pain, palpitations and leg swelling.  Gastrointestinal: Negative for abdominal pain, nausea and vomiting.  Genitourinary: Negative for dysuria, frequency and urgency.  Skin: Negative for itching and rash.  Neurological: Negative for dizziness and headaches.  Psychiatric/Behavioral: Negative for depression. The patient is not nervous/anxious.       Objective: Vitals:   07/18/17 1400  BP: 121/87  Pulse: 72  Resp: 16  Temp: 98 F (36.7 C)  TempSrc: Oral  SpO2: 99%  Weight: 196 lb 12.8 oz (89.3 kg)  Height: 5' 6.5" (1.689 m)    Physical Exam  Constitutional: She is oriented to person, place, and time. She appears well-developed and well-nourished.  HENT:  Head: Normocephalic  and atraumatic.  Right Ear: External ear normal.  Left Ear: External ear normal.  Mouth/Throat: Oropharynx is clear and moist.  Eyes: Conjunctivae and EOM are normal.  Neck: Normal range of motion. No thyromegaly present.  Cardiovascular: Normal rate, regular rhythm and normal heart sounds.  No murmur heard. Pulmonary/Chest: Effort normal and breath sounds normal. No stridor. No respiratory distress.  Abdominal: Soft. Bowel sounds are normal. She exhibits no distension. There is no tenderness. There is no guarding.  Musculoskeletal: Normal range of motion. She exhibits no edema.  Neurological:  She is alert and oriented to person, place, and time.  Skin: Skin is warm. Capillary refill takes less than 2 seconds. No erythema.  Psychiatric: She has a normal mood and affect. Her behavior is normal. Judgment and thought content normal.    Assessment and Plan Corky Craftsona was seen today for new patient (initial visit).  Diagnoses and all orders for this visit:  Encounter for health maintenance examination in adult- discussed heart health and prevention -     Comprehensive metabolic panel  Screening for diabetes mellitus- discussed diabetes prevention -     Hemoglobin A1c  Screening, lipid -     Lipid panel  Other orders -     Cancel: Flu Vaccine QUAD 36+ mos IM -     Cancel: Ambulatory referral to Gastroenterology -     Cancel: Tdap vaccine greater than or equal to 7yo IM     Margarette Vannatter A [employer]

## 2017-07-18 ENCOUNTER — Other Ambulatory Visit: Payer: Self-pay

## 2017-07-18 ENCOUNTER — Ambulatory Visit: Payer: 59 | Admitting: Family Medicine

## 2017-07-18 ENCOUNTER — Encounter: Payer: Self-pay | Admitting: Family Medicine

## 2017-07-18 ENCOUNTER — Ambulatory Visit: Payer: Managed Care, Other (non HMO) | Admitting: Family Medicine

## 2017-07-18 VITALS — BP 121/87 | HR 72 | Temp 98.0°F | Resp 16 | Ht 66.5 in | Wt 196.8 lb

## 2017-07-18 DIAGNOSIS — Z131 Encounter for screening for diabetes mellitus: Secondary | ICD-10-CM | POA: Diagnosis not present

## 2017-07-18 DIAGNOSIS — Z0000 Encounter for general adult medical examination without abnormal findings: Secondary | ICD-10-CM

## 2017-07-18 DIAGNOSIS — Z13220 Encounter for screening for lipoid disorders: Secondary | ICD-10-CM

## 2017-07-18 NOTE — Patient Instructions (Addendum)
Measurements:  Bust 41 inches Waist 34 inches Hips 44 inches    IF you received an x-ray today, you will receive an invoice from [facility]. Please contact [facility] at [phone] with questions or concerns regarding your invoice.   IF you received labwork today, you will receive an invoice from [facility]. Please contact [facility] at [phone] with questions or concerns regarding your invoice.   Our billing staff will not be able to assist you with questions regarding bills from these companies.  You will be contacted with the lab results as soon as they are available. The fastest way to get your results is to activate your My Chart account. Instructions are located on the last page of this paperwork. If you have not heard from [place] regarding the results in 2 weeks, please contact this office.     Preventive Care 40-64 Years, Female Preventive care refers to lifestyle choices and visits with your health care provider that can promote health and wellness. What does preventive care include?  A yearly physical exam. This is also called an annual well check.  Dental exams once or twice a year.  Routine eye exams. Ask your health care provider how often you should have your eyes checked.  Personal lifestyle choices, including: ? Daily care of your teeth and gums. ? Regular physical activity. ? Eating a healthy diet. ? Avoiding tobacco and drug use. ? Limiting alcohol use. ? Practicing safe sex. ? Taking low-dose aspirin daily starting at age 73. ? Taking vitamin and mineral supplements as recommended by your health care provider. What happens during an annual well check? The services and screenings done by your health care provider during your annual well check will depend on your age, overall health, lifestyle risk factors, and family history of disease. Counseling Your health care provider may ask you questions about your:  Alcohol use.  Tobacco  use.  Drug use.  Emotional well-being.  Home and relationship well-being.  Sexual activity.  Eating habits.  Work and work Statistician.  Method of birth control.  Menstrual cycle.  Pregnancy history.  Screening You may have the following tests or measurements:  Height, weight, and BMI.  Blood pressure.  Lipid and cholesterol levels. These may be checked every 5 years, or more frequently if you are over 63 years old.  Skin check.  Lung cancer screening. You may have this screening every year starting at age 31 if you have a 30-pack-year history of smoking and currently smoke or have quit within the past 15 years.  Fecal occult blood test (FOBT) of the stool. You may have this test every year starting at age 26.  Flexible sigmoidoscopy or colonoscopy. You may have a sigmoidoscopy every 5 years or a colonoscopy every 10 years starting at age 79.  Hepatitis C blood test.  Hepatitis B blood test.  Sexually transmitted disease (STD) testing.  Diabetes screening. This is done by checking your blood sugar (glucose) after you have not eaten for a while (fasting). You may have this done every 1-3 years.  Mammogram. This may be done every 1-2 years. Talk to your health care provider about when you should start having regular mammograms. This may depend on whether you have a family history of breast cancer.  BRCA-related cancer screening. This may be done if you have a family history of breast, ovarian, tubal, or peritoneal cancers.  Pelvic exam and Pap test. This may be done every 3 years starting at age 69. Starting at  age 30, this may be done every 5 years if you have a Pap test in combination with an HPV test.  Bone density scan. This is done to screen for osteoporosis. You may have this scan if you are at high risk for osteoporosis.  Discuss your test results, treatment options, and if necessary, the need for more tests with your health care provider. Vaccines Your  health care provider may recommend certain vaccines, such as:  Influenza vaccine. This is recommended every year.  Tetanus, diphtheria, and acellular pertussis (Tdap, Td) vaccine. You may need a Td booster every 10 years.  Varicella vaccine. You may need this if you have not been vaccinated.  Zoster vaccine. You may need this after age 60.  Measles, mumps, and rubella (MMR) vaccine. You may need at least one dose of MMR if you were born in 1957 or later. You may also need a second dose.  Pneumococcal 13-valent conjugate (PCV13) vaccine. You may need this if you have certain conditions and were not previously vaccinated.  Pneumococcal polysaccharide (PPSV23) vaccine. You may need one or two doses if you smoke cigarettes or if you have certain conditions.  Meningococcal vaccine. You may need this if you have certain conditions.  Hepatitis A vaccine. You may need this if you have certain conditions or if you travel or work in places where you may be exposed to hepatitis A.  Hepatitis B vaccine. You may need this if you have certain conditions or if you travel or work in places where you may be exposed to hepatitis B.  Haemophilus influenzae type b (Hib) vaccine. You may need this if you have certain conditions.  Talk to your health care provider about which screenings and vaccines you need and how often you need them. This information is not intended to replace advice given to you by your health care provider. Make sure you discuss any questions you have with your health care provider. Document Released: 06/24/2015 Document Revised: 02/15/2016 Document Reviewed: 03/29/2015 Elsevier Interactive Patient Education  2018 Elsevier Inc.  

## 2017-07-19 LAB — COMPREHENSIVE METABOLIC PANEL
ALBUMIN: 4.4 g/dL (ref 3.5–5.5)
ALK PHOS: 64 IU/L (ref 39–117)
ALT: 16 IU/L (ref 0–32)
AST: 20 IU/L (ref 0–40)
Albumin/Globulin Ratio: 1.6 (ref 1.2–2.2)
BUN / CREAT RATIO: 13 (ref 9–23)
BUN: 12 mg/dL (ref 6–24)
Bilirubin Total: 0.4 mg/dL (ref 0.0–1.2)
CALCIUM: 9.7 mg/dL (ref 8.7–10.2)
CO2: 21 mmol/L (ref 20–29)
Chloride: 106 mmol/L (ref 96–106)
Creatinine, Ser: 0.9 mg/dL (ref 0.57–1.00)
GFR calc Af Amer: 84 mL/min/{1.73_m2} (ref >59)
GFR, EST NON AFRICAN AMERICAN: 73 mL/min/{1.73_m2} (ref >59)
GLOBULIN, TOTAL: 2.8 g/dL (ref 1.5–4.5)
GLUCOSE: 81 mg/dL (ref 65–99)
Potassium: 4.2 mmol/L (ref 3.5–5.2)
SODIUM: 141 mmol/L (ref 134–144)
Total Protein: 7.2 g/dL (ref 6.0–8.5)

## 2017-07-19 LAB — HEMOGLOBIN A1C
Est. average glucose Bld gHb Est-mCnc: 117 mg/dL
Hgb A1c MFr Bld: 5.7 % — ABNORMAL HIGH (ref 4.8–5.6)

## 2017-07-19 LAB — LIPID PANEL
Chol/HDL Ratio: 2.9 ratio (ref 0.0–4.4)
Cholesterol, Total: 203 mg/dL — ABNORMAL HIGH (ref 100–199)
HDL: 70 mg/dL (ref >39)
LDL Calculated: 122 mg/dL — ABNORMAL HIGH (ref 0–99)
TRIGLYCERIDES: 57 mg/dL (ref 0–149)
VLDL Cholesterol Cal: 11 mg/dL (ref 5–40)

## 2017-07-20 ENCOUNTER — Encounter: Payer: Self-pay | Admitting: Family Medicine

## 2017-07-22 ENCOUNTER — Telehealth: Payer: Self-pay

## 2017-07-22 NOTE — Telephone Encounter (Signed)
Copied from CRM #[id number]. Topic: General - Call Back - No Documentation >> Jul 22, 2017  9:35 AM Taylor, Brittany L wrote: Patient missed call from office call, back at [phone] leave a message please. 

## 2017-07-22 NOTE — Telephone Encounter (Signed)
Left message for pt to call office.  Calling pt to let her know Physician result form faxed back to [phone] and confirmation fax received at 8:51a.m. Dgaddy, CMA

## 2017-07-22 NOTE — Telephone Encounter (Signed)
A user error has taken place: encounter opened in error, closed for administrative reasons.

## 2017-07-22 NOTE — Telephone Encounter (Deleted)
Copied from CRM [phone]. Topic: General - Call Back - No Documentation >> Jul 22, 2017  9:35 AM Jolayne Hainesaylor, Brittany L wrote: Patient missed call from office call, back at [phone] leave a message please.

## 2017-07-23 NOTE — Telephone Encounter (Signed)
Pt notified and verbalized understanding.

## 2018-07-15 ENCOUNTER — Other Ambulatory Visit: Payer: Self-pay | Admitting: Obstetrics & Gynecology

## 2018-07-15 DIAGNOSIS — Z1231 Encounter for screening mammogram for malignant neoplasm of breast: Secondary | ICD-10-CM

## 2018-08-15 ENCOUNTER — Ambulatory Visit: Payer: Managed Care, Other (non HMO)

## 2018-09-15 ENCOUNTER — Ambulatory Visit: Payer: Managed Care, Other (non HMO)

## 2018-10-28 ENCOUNTER — Ambulatory Visit: Payer: Managed Care, Other (non HMO)

## 2018-12-22 ENCOUNTER — Ambulatory Visit
Admission: RE | Admit: 2018-12-22 | Discharge: 2018-12-22 | Disposition: A | Discharge status: Home / Self Care | Payer: 59 | Source: Ambulatory Visit | Attending: Obstetrics & Gynecology | Admitting: Obstetrics & Gynecology

## 2018-12-22 DIAGNOSIS — Z1231 Encounter for screening mammogram for malignant neoplasm of breast: Secondary | ICD-10-CM

## 2019-09-17 ENCOUNTER — Ambulatory Visit: Payer: Self-pay | Attending: Internal Medicine

## 2019-09-17 DIAGNOSIS — Z23 Encounter for immunization: Secondary | ICD-10-CM

## 2019-09-17 NOTE — Progress Notes (Signed)
   Covid-19 Vaccination Clinic  Name:  [name]    MRN: [id number] DOB: [birth year]  09/17/2019  [name] was observed post Covid-19 immunization for 15 minutes without incident. She was provided with Vaccine Information Sheet and instruction to access the V-Safe system.   [name] was instructed to call 911 with any severe reactions post vaccine: Marland Kitchen Difficulty breathing  . Swelling of face and throat  . A fast heartbeat  . A bad rash all over body  . Dizziness and weakness   Immunizations Administered    Name Date Dose VIS Date Route   Pfizer COVID-19 Vaccine 09/17/2019  9:48 AM 0.3 mL 05/22/2019 Intramuscular   Manufacturer: [employer], [employer]   Lot: [id number]   NDC: [address]-2

## 2019-10-12 ENCOUNTER — Ambulatory Visit: Payer: Self-pay

## 2019-10-14 ENCOUNTER — Ambulatory Visit: Payer: Self-pay | Attending: Internal Medicine

## 2019-10-14 DIAGNOSIS — Z23 Encounter for immunization: Secondary | ICD-10-CM

## 2019-10-14 NOTE — Progress Notes (Signed)
   Covid-19 Vaccination Clinic  Name:  OYINKANSOLA TRUAX    MRN: [id number] DOB: [birth year]  10/14/2019  Ms. Bachmeier was observed post Covid-19 immunization for 15 minutes without incident. She was provided with Vaccine Information Sheet and instruction to access the V-Safe system.   Ms. Gubler was instructed to call 911 with any severe reactions post vaccine: Marland Kitchen Difficulty breathing  . Swelling of face and throat  . A fast heartbeat  . A bad rash all over body  . Dizziness and weakness   Immunizations Administered    Name Date Dose VIS Date Route   Pfizer COVID-19 Vaccine 10/14/2019  9:50 AM 0.3 mL 08/05/2018 Intramuscular   Manufacturer: [employer], [employer]   Lot: [id number]   NDC: [address]-9

## 2019-10-26 ENCOUNTER — Ambulatory Visit: Payer: Self-pay

## 2020-10-22 ENCOUNTER — Emergency Department (HOSPITAL_COMMUNITY): Payer: BLUE CROSS/BLUE SHIELD

## 2020-10-22 ENCOUNTER — Other Ambulatory Visit: Payer: Self-pay

## 2020-10-22 ENCOUNTER — Emergency Department (HOSPITAL_COMMUNITY)
Admission: EM | Admit: 2020-10-22 | Discharge: 2020-10-22 | Disposition: A | Discharge status: Home / Self Care | Payer: BLUE CROSS/BLUE SHIELD | Attending: Emergency Medicine | Admitting: Emergency Medicine

## 2020-10-22 ENCOUNTER — Encounter (HOSPITAL_COMMUNITY): Payer: Self-pay

## 2020-10-22 DIAGNOSIS — R350 Frequency of micturition: Secondary | ICD-10-CM | POA: Diagnosis not present

## 2020-10-22 DIAGNOSIS — R079 Chest pain, unspecified: Secondary | ICD-10-CM | POA: Insufficient documentation

## 2020-10-22 DIAGNOSIS — R091 Pleurisy: Secondary | ICD-10-CM

## 2020-10-22 DIAGNOSIS — R109 Unspecified abdominal pain: Secondary | ICD-10-CM | POA: Diagnosis present

## 2020-10-22 LAB — COMPREHENSIVE METABOLIC PANEL
ALT: 26 U/L (ref 0–44)
AST: 26 U/L (ref 15–41)
Albumin: 4.2 g/dL (ref 3.5–5.0)
Alkaline Phosphatase: 63 U/L (ref 38–126)
Anion gap: 9 (ref 5–15)
BUN: 16 mg/dL (ref 6–20)
CO2: 20 mmol/L — ABNORMAL LOW (ref 22–32)
Calcium: 9.3 mg/dL (ref 8.9–10.3)
Chloride: 103 mmol/L (ref 98–111)
Creatinine, Ser: 0.75 mg/dL (ref 0.44–1.00)
GFR, Estimated: >60 mL/min (ref >60)
Glucose, Bld: 101 mg/dL — ABNORMAL HIGH (ref 70–99)
Potassium: 3.3 mmol/L — ABNORMAL LOW (ref 3.5–5.1)
Sodium: 132 mmol/L — ABNORMAL LOW (ref 135–145)
Total Bilirubin: 0.9 mg/dL (ref 0.3–1.2)
Total Protein: 7.3 g/dL (ref 6.5–8.1)

## 2020-10-22 LAB — CBC WITH DIFFERENTIAL/PLATELET
Abs Immature Granulocytes: 0.01 10*3/uL (ref 0.00–0.07)
Basophils Absolute: 0 10*3/uL (ref 0.0–0.1)
Basophils Relative: 0 %
Eosinophils Absolute: 0 10*3/uL (ref 0.0–0.5)
Eosinophils Relative: 1 %
HCT: 38.1 % (ref 36.0–46.0)
Hemoglobin: 12.2 g/dL (ref 12.0–15.0)
Immature Granulocytes: 0 %
Lymphocytes Relative: 21 %
Lymphs Abs: 1.3 10*3/uL (ref 0.7–4.0)
MCH: 27.4 pg (ref 26.0–34.0)
MCHC: 32 g/dL (ref 30.0–36.0)
MCV: 85.4 fL (ref 80.0–100.0)
Monocytes Absolute: 0.4 10*3/uL (ref 0.1–1.0)
Monocytes Relative: 6 %
Neutro Abs: 4.5 10*3/uL (ref 1.7–7.7)
Neutrophils Relative %: 72 %
Platelets: 293 10*3/uL (ref 150–400)
RBC: 4.46 MIL/uL (ref 3.87–5.11)
RDW: 14.1 % (ref 11.5–15.5)
WBC: 6.1 10*3/uL (ref 4.0–10.5)
nRBC: 0 % (ref 0.0–0.2)

## 2020-10-22 LAB — URINALYSIS, ROUTINE W REFLEX MICROSCOPIC
Bilirubin Urine: NEGATIVE
Glucose, UA: NEGATIVE mg/dL
Hgb urine dipstick: NEGATIVE
Ketones, ur: 5 mg/dL — AB
Leukocytes,Ua: NEGATIVE
Nitrite: NEGATIVE
Protein, ur: NEGATIVE mg/dL
Specific Gravity, Urine: 1.023 (ref 1.005–1.030)
pH: 6 (ref 5.0–8.0)

## 2020-10-22 LAB — LIPASE, BLOOD: Lipase: 22 U/L (ref 11–51)

## 2020-10-22 MED ORDER — MORPHINE SULFATE (PF) 4 MG/ML IV SOLN
6.0000 mg | Freq: Once | INTRAVENOUS | Status: DC
  Filled 2020-10-22: qty 2

## 2020-10-22 MED ORDER — KETOROLAC TROMETHAMINE 30 MG/ML IJ SOLN
30.0000 mg | Freq: Once | INTRAMUSCULAR | Status: AC
  Administered 2020-10-22: 30 mg via INTRAVENOUS
  Filled 2020-10-22: qty 1

## 2020-10-22 MED ORDER — TECHNETIUM TO 99M ALBUMIN AGGREGATED
4.4000 | Freq: Once | INTRAVENOUS | Status: AC | PRN
  Administered 2020-10-22: 4.4 via INTRAVENOUS

## 2020-10-22 MED ORDER — LACTATED RINGERS IV SOLN: INTRAVENOUS | Status: DC

## 2020-10-22 MED ORDER — IBUPROFEN 600 MG PO TABS: 600.0000 mg | ORAL_TABLET | Freq: Four times a day (QID) | ORAL | 0 refills | Status: AC | PRN

## 2020-10-22 MED ORDER — OXYCODONE-ACETAMINOPHEN 5-325 MG PO TABS
1.0000 | ORAL_TABLET | Freq: Once | ORAL | Status: AC
  Administered 2020-10-22: 1 via ORAL
  Filled 2020-10-22: qty 1

## 2020-10-22 MED ORDER — MORPHINE SULFATE (PF) 4 MG/ML IV SOLN
6.0000 mg | Freq: Once | INTRAVENOUS | Status: AC
  Administered 2020-10-22: 4 mg via INTRAVENOUS
  Filled 2020-10-22: qty 2

## 2020-10-22 MED ORDER — METHOCARBAMOL 500 MG PO TABS: 500.0000 mg | ORAL_TABLET | Freq: Two times a day (BID) | ORAL | 0 refills | Status: AC

## 2020-10-22 NOTE — ED Triage Notes (Signed)
Patient c/o right flank pain that radiates into the right abdomen since 0800 today. Patient denies hematuria, dysuria, or frequency.

## 2020-10-22 NOTE — ED Provider Notes (Signed)
[facility]-EMERGENCY DEPT Provider Note   CSN: [id number] Arrival date & time: 10/22/20  1301     History Chief Complaint  Patient presents with  . Flank Pain    Tammy Villarreal is a 60 y.o. female.  60 year old female presents with acute onset of pain to her right flank which radiates to her right upper quadrant.  States that she is never had this before in the past.  Denies any dyspnea on exertion.  No anginal type symptoms.  No leg pain or swelling.  States that she is not had any dysuria but does note some urinary frequency.  Denies any rashes to the skin.  No treatment use prior to arrival        History reviewed. No pertinent past medical history.  There are no problems to display for this patient.   Past Surgical History:  Procedure Laterality Date  . CESAREAN SECTION    . TUBAL LIGATION       OB History   No obstetric history on file.     Family History  Problem Relation Age of Onset  . Hypertension Mother   . Heart disease Father   . Diabetes Father   . Cancer Maternal Aunt 78    Social History   Tobacco Use  . Smoking status: Never Smoker  . Smokeless tobacco: Never Used  Vaping Use  . Vaping Use: Never used  Substance Use Topics  . Alcohol use: No  . Drug use: No    Home Medications Prior to Admission medications   Medication Sig Start Date End Date Taking? Authorizing Provider  diphenhydrAMINE (BENADRYL) 25 MG tablet Take 25 mg by mouth every 6 (six) hours as needed for allergies.    [provider]  HYDROcodone-acetaminophen (NORCO) 5-325 MG per tablet Take 1-2 tablets by mouth every 6 (six) hours as needed. Patient not taking: Reported on 07/18/2017 02/14/15   Geoffery Lyons, MD  ibuprofen (ADVIL,MOTRIN) 200 MG tablet Take 200 mg by mouth every 6 (six) hours as needed.    [provider]  Multiple Vitamin (MULTIVITAMIN) tablet Take 1 tablet by mouth daily.    [provider]    Allergies     Patient has no known allergies.  Review of Systems   Review of Systems  All other systems reviewed and are negative.   Physical Exam Updated Vital Signs BP (!) 156/99 (BP Location: Left Arm)   Pulse 66   Temp 98.5 F (36.9 C) (Oral)   Resp 16   Ht 1.702 m (5\' 7" )   Wt 81.6 kg   SpO2 100%   BMI 28.19 kg/m   Physical Exam Vitals and nursing note reviewed.  Constitutional:      General: She is not in acute distress.    Appearance: Normal appearance. She is well-developed. She is not toxic-appearing.  HENT:     Head: Normocephalic and atraumatic.  Eyes:     General: Lids are normal.     Conjunctiva/sclera: Conjunctivae normal.     Pupils: Pupils are equal, round, and reactive to light.  Neck:     Thyroid: No thyroid mass.     Trachea: No tracheal deviation.  Cardiovascular:     Rate and Rhythm: Normal rate and regular rhythm.     Heart sounds: Normal heart sounds. No murmur heard. No gallop.   Pulmonary:     Effort: Pulmonary effort is normal. No respiratory distress.     Breath sounds: Normal breath sounds.  No stridor. No decreased breath sounds, wheezing, rhonchi or rales.  Abdominal:     General: Bowel sounds are normal. There is no distension.     Palpations: Abdomen is soft.     Tenderness: There is no abdominal tenderness. There is no rebound.    Musculoskeletal:        General: No tenderness. Normal range of motion.     Cervical back: Normal range of motion and neck supple.  Skin:    General: Skin is warm and dry.     Findings: No abrasion or rash.  Neurological:     Mental Status: She is alert and oriented to person, place, and time.     GCS: GCS eye subscore is 4. GCS verbal subscore is 5. GCS motor subscore is 6.     Cranial Nerves: No cranial nerve deficit.     Sensory: No sensory deficit.  Psychiatric:        Speech: Speech normal.        Behavior: Behavior normal.     ED Results / Procedures / Treatments   Labs (all labs ordered are listed,  but only abnormal results are displayed) Labs Reviewed  URINALYSIS, ROUTINE W REFLEX MICROSCOPIC    EKG None  Radiology No results found.  Procedures Procedures   Medications Ordered in ED Medications  oxyCODONE-acetaminophen (PERCOCET/ROXICET) 5-325 MG per tablet 1 tablet (has no administration in time range)    ED Course  I have reviewed the triage vital signs and the nursing notes.  Pertinent labs & imaging results that were available during my care of the patient were reviewed by me and considered in my medical decision making (see chart for details).    MDM Rules/Calculators/A&P                         Urinalysis negative.  Abdominal labs reassuring. Patient reported chest pain.  Chest x-ray without acute findings.  Labs are reassuring here.  VQ scan was negative for PE.  Medicated for pain and does feel somewhat better.  Suspect pleurisy and will discharge Final Clinical Impression(s) / ED Diagnoses Final diagnoses:  None    Rx / DC Orders ED Discharge Orders    None       Lorre Nick, MD 10/22/20 2004

## 2020-10-22 NOTE — ED Notes (Signed)
VQ scan ETA [id number]

## 2023-02-13 ENCOUNTER — Other Ambulatory Visit: Payer: Self-pay | Admitting: Obstetrics & Gynecology

## 2023-02-13 DIAGNOSIS — Z1231 Encounter for screening mammogram for malignant neoplasm of breast: Secondary | ICD-10-CM

## 2023-02-28 ENCOUNTER — Ambulatory Visit
Admission: RE | Admit: 2023-02-28 | Discharge: 2023-02-28 | Disposition: A | Discharge status: Home / Self Care | Payer: 59 | Source: Ambulatory Visit | Attending: Obstetrics & Gynecology | Admitting: Obstetrics & Gynecology

## 2023-02-28 DIAGNOSIS — Z1231 Encounter for screening mammogram for malignant neoplasm of breast: Secondary | ICD-10-CM

## 2023-09-09 ENCOUNTER — Encounter: Payer: Self-pay | Admitting: Physician Assistant

## 2023-09-09 ENCOUNTER — Ambulatory Visit: Payer: Self-pay | Admitting: Physician Assistant

## 2023-09-09 VITALS — BP 145/87 | HR 68 | Ht 67.5 in | Wt 183.0 lb

## 2023-09-09 DIAGNOSIS — R030 Elevated blood-pressure reading, without diagnosis of hypertension: Secondary | ICD-10-CM

## 2023-09-09 DIAGNOSIS — J302 Other seasonal allergic rhinitis: Secondary | ICD-10-CM

## 2023-09-09 DIAGNOSIS — G44201 Tension-type headache, unspecified, intractable: Secondary | ICD-10-CM

## 2023-09-09 NOTE — Progress Notes (Unsigned)
 New Patient Office Visit  Subjective    Patient ID: Tammy Villarreal, female    DOB: 04-12-1961  Age: 63 y.o. MRN: [id number]  CC:  Chief Complaint  Patient presents with   Headache    Patient states she has been having frequent headaches and feeling overheated at work. She has been asked to wear a different type of protection that she feels is contributing to her symptoms. She has noticed a rash forming on the left side of neck, that tends to spread when she is wearing the protection.     Hypertension    Blood pressure is elevated, pt denies any bp medication In past     HPI Tammy Villarreal presents to establish care ***   Outpatient Encounter Medications as of 09/09/2023  Medication Sig   Ascorbic Acid (VITAMIN C) 100 MG tablet Take 100 mg by mouth daily. (Patient not taking: Reported on 09/09/2023)   diphenhydrAMINE (BENADRYL) 25 MG tablet Take 25 mg by mouth every 6 (six) hours as needed for allergies. (Patient not taking: Reported on 09/09/2023)   HYDROcodone-acetaminophen (NORCO) 5-325 MG per tablet Take 1-2 tablets by mouth every 6 (six) hours as needed. (Patient not taking: Reported on 07/18/2017)   ibuprofen (ADVIL) 600 MG tablet Take 1 tablet (600 mg total) by mouth every 6 (six) hours as needed. (Patient not taking: Reported on 09/09/2023)   ibuprofen (ADVIL,MOTRIN) 200 MG tablet Take 200 mg by mouth every 6 (six) hours as needed for mild pain. (Patient not taking: Reported on 09/09/2023)   methocarbamol (ROBAXIN) 500 MG tablet Take 1 tablet (500 mg total) by mouth 2 (two) times daily. (Patient not taking: Reported on 09/09/2023)   Multiple Vitamin (MULTIVITAMIN) tablet Take 1 tablet by mouth daily. (Patient not taking: Reported on 09/09/2023)   zinc gluconate 50 MG tablet Take 50 mg by mouth daily. (Patient not taking: Reported on 09/09/2023)   No facility-administered encounter medications on file as of 09/09/2023.    No past medical history on file.  Past Surgical History:   Procedure Laterality Date   CESAREAN SECTION     TUBAL LIGATION      Family History  Problem Relation Age of Onset   Hypertension Mother    Heart disease Father    Diabetes Father    Cancer Maternal Aunt 50    Social History   Socioeconomic History   Marital status: Single    Spouse name: Not on file   Number of children: Not on file   Years of education: Not on file   Highest education level: Not on file  Occupational History   Not on file  Tobacco Use   Smoking status: Never   Smokeless tobacco: Never  Vaping Use   Vaping status: Never Used  Substance and Sexual Activity   Alcohol use: No   Drug use: No   Sexual activity: Yes    Birth control/protection: Surgical  Other Topics Concern   Not on file  Social History Narrative   Not on file   Social Drivers of Health   Financial Resource Strain: Not on file  Food Insecurity: No Food Insecurity (07/18/2017)   Hunger Vital Sign    Worried About Running Out of Food in the Last Year: Never true    Ran Out of Food in the Last Year: Never true  Transportation Needs: No Transportation Needs (07/18/2017)   PRAPARE - Transportation    Lack of Transportation (Medical): No    Lack of  Transportation (Non-Medical): No  Physical Activity: Sufficiently Active (07/18/2017)   Exercise Vital Sign    Days of Exercise per Week: 5 days    Minutes of Exercise per Session: 90 min  Stress: Stress Concern Present (07/18/2017)   [employer] of Occupational Health - Occupational Stress Questionnaire    Feeling of Stress : To some extent  Social Connections: Unknown (07/18/2017)   Social Connection and Isolation Panel [NHANES]    Frequency of Communication with Friends and Family: Three times a week    Frequency of Social Gatherings with Friends and Family: Twice a week    Attends Religious Services: More than 4 times per year    Active Member of [employer] or Organizations: Yes    Attends Engineer, structural: More than 4 times  per year    Marital Status: Not on file  Intimate Partner Violence: Not At Risk (07/18/2017)   Humiliation, Afraid, Rape, and Kick questionnaire    Fear of Current or Ex-Partner: No    Emotionally Abused: No    Physically Abused: No    Sexually Abused: No    ROS      Objective    BP (!) 146/90 (BP Location: Left Arm, Patient Position: Sitting, Cuff Size: Large)   Pulse 76   Ht 5' 7.5" (1.715 m)   Wt 183 lb (83 kg)   LMP 06/06/2017   SpO2 98%   BMI 28.24 kg/m   Physical Exam  {Labs (Optional):23779}    Assessment & Plan:   Problem List Items Addressed This Visit   None   No follow-ups on file.   Tammy Knudsen Mayers, PA-C

## 2023-09-09 NOTE — Patient Instructions (Addendum)
 VISIT SUMMARY:  During your visit, we discussed your recent headaches and rash, which seem to be related to a new smock you are wearing at work. We also noted that your blood pressure was elevated, and we reviewed your history of low potassium and allergies.  YOUR PLAN:  -HEADACHES AND RASH: Your headaches and rash are likely due to an irritant or allergic reaction to the material or metal in your new work smock. Continue using ibuprofen and acetaminophen for headache relief and cortisone cream for the rash. Consider changing or modifying your work attire to prevent the rash. We also recommend applying for [facility] financial assistance and adult Medicaid for potential healthcare coverage.  -ELEVATED BLOOD PRESSURE: Your blood pressure reading was 145/87 mmHg, which is elevated. Elevated blood pressure can increase the risk of heart disease and stroke. We recommend monitoring your blood pressure regularly over the next few weeks. Aim for readings of 130/80 mmHg or below. You can purchase a blood pressure cuff or use available machines at [facility] or [facility]. Return for evaluation if elevated readings persist. We will also order blood work to rule out other causes of elevated blood pressure.  -LOW POTASSIUM: Previous lab results indicated low potassium levels, which can affect muscle function and heart health. We will order blood work to check your current potassium levels.  -GENERAL HEALTH MAINTENANCE: You have had a mammogram and gynecological exam. We recommend applying for [place] financial assistance and adult Medicaid to ensure access to preventive care. We will also order a cholesterol test.  INSTRUCTIONS:  Monitor your blood pressure regularly and aim for readings of 130/80 mmHg or below. Return for evaluation if elevated readings persist. Apply for [facility] financial assistance and adult Medicaid. We will order blood work to check your potassium levels and a cholesterol test.  How to  Take Your Blood Pressure Blood pressure is a measurement of how strongly your blood is pressing against the walls of your arteries. Arteries are blood vessels that carry blood from your heart throughout your body. Your health care provider takes your blood pressure at each office visit. You can also take your own blood pressure at home with a blood pressure monitor. You may need to take your own blood pressure to: Confirm a diagnosis of high blood pressure (hypertension). Monitor your blood pressure over time. Make sure your blood pressure medicine is working. Supplies needed: Blood pressure monitor. A chair to sit in. This should be a chair where you can sit upright with your back supported. Do not sit on a soft couch or an armchair. Table or desk. Small notebook and pencil or pen. How to prepare To get the most accurate reading, avoid the following for 30 minutes before you check your blood pressure: Drinking caffeine. Drinking alcohol. Eating. Smoking. Exercising. Five minutes before you check your blood pressure: Use the bathroom and urinate so that you have an empty bladder. Sit quietly in a chair. Do not talk. How to take your blood pressure To check your blood pressure, follow the instructions in the manual that came with your blood pressure monitor. If you have a digital blood pressure monitor, the instructions may be as follows: Sit up straight in a chair. Place your feet on the floor. Do not cross your ankles or legs. Rest your left arm at the level of your heart on a table or desk or on the arm of a chair. Pull up your shirt sleeve. Wrap the blood pressure cuff around the  upper part of your left arm, 1 inch (2.5 cm) above your elbow. It is best to wrap the cuff around bare skin. Fit the cuff snugly, but not too tightly, around your arm. You should be able to place only one finger between the cuff and your arm. Position the cord so that it rests in the bend of your  elbow. Press the power button. Sit quietly while the cuff inflates and deflates. Read the digital reading on the monitor screen and write the numbers down (record them) in a notebook. Wait 2-3 minutes, then repeat the steps, starting at step 1. What does my blood pressure reading mean? A blood pressure reading consists of a higher number over a lower number. Ideally, your blood pressure should be below 120/80. The first ("top") number is called the systolic pressure. It is a measure of the pressure in your arteries as your heart beats. The second ("bottom") number is called the diastolic pressure. It is a measure of the pressure in your arteries as the heart relaxes. Blood pressure is classified into four stages. The following are the stages for adults who do not have a short-term serious illness or a chronic condition. Systolic pressure and diastolic pressure are measured in a unit called mm Hg (millimeters of mercury).  Normal Systolic pressure: below 120. Diastolic pressure: below 80. Elevated Systolic pressure: 120-129. Diastolic pressure: below 80. Hypertension stage 1 Systolic pressure: 130-139. Diastolic pressure: 80-89. Hypertension stage 2 Systolic pressure: 140 or above. Diastolic pressure: 90 or above. You can have elevated blood pressure or hypertension even if only the systolic or only the diastolic number in your reading is higher than normal. Follow these instructions at home: Medicines Take over-the-counter and prescription medicines only as told by your health care provider. Tell your health care provider if you are having any side effects from blood pressure medicine. General instructions Check your blood pressure as often as recommended by your health care provider. Check your blood pressure at the same time every day. Take your monitor to the next appointment with your health care provider to make sure that: You are using it correctly. It provides accurate  readings. Understand what your goal blood pressure numbers are. Keep all follow-up visits. This is important. General tips Your health care provider can suggest a reliable monitor that will meet your needs. There are several types of home blood pressure monitors. Choose a monitor that has an arm cuff. Do not choose a monitor that measures your blood pressure from your wrist or finger. Choose a cuff that wraps snugly, not too tight or too loose, around your upper arm. You should be able to fit only one finger between your arm and the cuff. You can buy a blood pressure monitor at most drugstores or online. Where to find more information American Heart Association: www.heart.org Contact a health care provider if: Your blood pressure is consistently high. Your blood pressure is suddenly low. Get help right away if: Your systolic blood pressure is higher than 180. Your diastolic blood pressure is higher than 120. These symptoms may be an emergency. Get help right away. Call 911. Do not wait to see if the symptoms will go away. Do not drive yourself to the hospital. Summary Blood pressure is a measurement of how strongly your blood is pressing against the walls of your arteries. A blood pressure reading consists of a higher number over a lower number. Ideally, your blood pressure should be below 120/80. Check your blood pressure at  the same time every day. Avoid caffeine, alcohol, smoking, and exercise for 30 minutes prior to checking your blood pressure. These agents can affect the accuracy of the blood pressure reading. This information is not intended to replace advice given to you by your health care provider. Make sure you discuss any questions you have with your health care provider. Document Revised: 02/09/2021 Document Reviewed: 02/09/2021 Elsevier Patient Education  2024 [employer].

## 2023-09-10 ENCOUNTER — Encounter: Payer: Self-pay | Admitting: Physician Assistant

## 2023-09-10 LAB — COMP. METABOLIC PANEL (12)
AST: 25 IU/L (ref 0–40)
Albumin: 4.5 g/dL (ref 3.8–4.9)
Alkaline Phosphatase: 90 IU/L (ref 44–121)
BUN/Creatinine Ratio: 14 (ref 12–28)
BUN: 12 mg/dL (ref 8–27)
Bilirubin Total: 0.3 mg/dL (ref 0.0–1.2)
Calcium: 10.4 mg/dL — ABNORMAL HIGH (ref 8.7–10.3)
Chloride: 108 mmol/L — ABNORMAL HIGH (ref 96–106)
Creatinine, Ser: 0.85 mg/dL (ref 0.57–1.00)
Globulin, Total: 2.4 g/dL (ref 1.5–4.5)
Glucose: 82 mg/dL (ref 70–99)
Potassium: 5.3 mmol/L — ABNORMAL HIGH (ref 3.5–5.2)
Sodium: 143 mmol/L (ref 134–144)
Total Protein: 6.9 g/dL (ref 6.0–8.5)
eGFR: 78 mL/min/{1.73_m2} (ref >59)

## 2023-09-10 LAB — CBC WITH DIFFERENTIAL/PLATELET
Basophils Absolute: 0 10*3/uL (ref 0.0–0.2)
Basos: 0 %
EOS (ABSOLUTE): 0.2 10*3/uL (ref 0.0–0.4)
Eos: 2 %
Hematocrit: 37.4 % (ref 34.0–46.6)
Hemoglobin: 11.9 g/dL (ref 11.1–15.9)
Immature Grans (Abs): 0 10*3/uL (ref 0.0–0.1)
Immature Granulocytes: 0 %
Lymphocytes Absolute: 2.7 10*3/uL (ref 0.7–3.1)
Lymphs: 39 %
MCH: 26.4 pg — ABNORMAL LOW (ref 26.6–33.0)
MCHC: 31.8 g/dL (ref 31.5–35.7)
MCV: 83 fL (ref 79–97)
Monocytes Absolute: 0.4 10*3/uL (ref 0.1–0.9)
Monocytes: 6 %
Neutrophils Absolute: 3.5 10*3/uL (ref 1.4–7.0)
Neutrophils: 53 %
Platelets: 334 10*3/uL (ref 150–450)
RBC: 4.5 x10E6/uL (ref 3.77–5.28)
RDW: 13.6 % (ref 11.7–15.4)
WBC: 6.8 10*3/uL (ref 3.4–10.8)

## 2023-09-10 LAB — LIPID PANEL
Chol/HDL Ratio: 2.9 ratio (ref 0.0–4.4)
Cholesterol, Total: 220 mg/dL — ABNORMAL HIGH (ref 100–199)
HDL: 75 mg/dL (ref >39)
LDL Chol Calc (NIH): 127 mg/dL — ABNORMAL HIGH (ref 0–99)
Triglycerides: 104 mg/dL (ref 0–149)
VLDL Cholesterol Cal: 18 mg/dL (ref 5–40)

## 2023-09-10 LAB — VITAMIN D 25 HYDROXY (VIT D DEFICIENCY, FRACTURES): Vit D, 25-Hydroxy: 40.3 ng/mL (ref 30.0–100.0)

## 2023-09-10 LAB — TSH: TSH: 3.2 u[IU]/mL (ref 0.450–4.500)

## 2023-10-23 ENCOUNTER — Telehealth: Payer: Self-pay | Admitting: Family Medicine

## 2023-10-23 NOTE — Telephone Encounter (Unsigned)
 Copied from CRM [id number]. Topic: General - Other >> Oct 23, 2023 11:21 AM Baldomero Bone wrote: Reason for CRM: Concha Deed with [facility] needs insurance information for the patient. Callback number is [phone]

## 2023-10-23 NOTE — Telephone Encounter (Signed)
Please send to the correct office -

## 2024-04-28 ENCOUNTER — Encounter: Payer: Self-pay | Admitting: *Deleted

## 2024-04-28 NOTE — Progress Notes (Signed)
 Pt came to [facility] unit on 09/09/23 during a cardiometabolic clinic, with Kirk Sage, PAC, c/o a h/a that pt noted might be related to the new smock she was wearing at work. During this visit, pt's b/p was 145/87 and was instructed by [facility] Mayers to monitor her b/p over the next few weeks.  Ps also noted needing insurance and was referred to info about Medicaid, [employer], or [facility] financial assistance per PA Mayers. Health equity team unable to contact pt by phone and VM left. Letter sent to pt with PCP (Get Care Now and [facility] flyers) and insurance info (medicaid, [place], [facility] financial assistance) as well as [phone] and food resources flyer. Additional f/u to be scheduled per Health Equity schedule.

## 2024-06-12 NOTE — Progress Notes (Signed)
 Per the initial f/u interval,Pt came to [facility] unit on 09/09/23 during a cardiometabolic clinic, with Kirk Sage, PAC, c/o a h/a that pt noted might be related to the new smock she was wearing at work. During this visit, pt's b/p was 145/87 and was instructed by [facility] Mayers to monitor her b/p over the next few weeks. Ps also noted needing insurance and was referred to info about Medicaid, ACA, or [facility] financial assistance per PA Mayers. Health equity team unable to contact pt by phone and VM left. Letter sent to pt with PCP (Get Care Now and [facility] flyers) and insurance info (medicaid, [place], [facility] financial assistance) as well as [phone] and food resources flyer. Additional f/u to be scheduled per Health Equity schedule.  During the 60 day f/u interval, the pt does not have any new [facility] visible encounters since the initial f/u interval. CHW did a RTE on pt and her insurance still says Community Education Officer.  60 Day Call Attempt: CHW called pt's mobile number to obtain PCP, food SDOH need and insurance status.  CHW was unable to reach pt and left a vm for her to call back.  CHW sent a letter with Get Care Now, [facility] Flyers, [place] and [employer] insurance flyers, and [place], [employer] flyer and bp education in case needed by the pt.   An additional follow up will be done in according to the health equity team's protocol.
